# Patient Record
Sex: Female | Born: 1985
Health system: Southern US, Community
[De-identification: ages and names within clinical notes are randomized; demographics above are authoritative.]

## PROBLEM LIST (undated history)

## (undated) DIAGNOSIS — I10 Essential (primary) hypertension: Secondary | ICD-10-CM

## (undated) HISTORY — PX: NO PAST SURGERIES: SHX2092

---

## 2016-07-07 NOTE — L&D Delivery Note (Signed)
Delivery Note Patient presented yesterday with vaginal bleeding and scant prenatal care.  Received 1 dose of betamethasone last night.  At 3:55 PM a viable female was delivered via  (Presentation: OA).  APGAR: 3, 9; weight 2800g.   Placenta status: spontaneous, with significant clot. Active management of the 3rd stage of labor. Cord: 3VC with the following complications: none.  Clamped x2 and cut at 45 seconds due to fetal indications.  Anesthesia:  epidural Episiotomy:  none Lacerations:  none Est. Blood Loss (mL):  250  Mom to postpartum.  Baby to NICU.  Jill MerlesJulia Sparks 09/30/2016, 4:15 PM   I was gloved and present for entire delivery SVD without incident No difficulty with shoulders No lacerations  Jill SignsMarie L Onda Sparks, CNM

## 2016-09-29 ENCOUNTER — Inpatient Hospital Stay (HOSPITAL_COMMUNITY)
Admission: AD | Admit: 2016-09-29 | Discharge: 2016-10-01 | DRG: 774 | Disposition: A | Discharge status: Home / Self Care | Payer: Medicaid Other | Source: Ambulatory Visit | Attending: Family Medicine | Admitting: Family Medicine

## 2016-09-29 ENCOUNTER — Inpatient Hospital Stay (HOSPITAL_COMMUNITY): Payer: Medicaid Other

## 2016-09-29 ENCOUNTER — Encounter (HOSPITAL_COMMUNITY): Payer: Self-pay | Admitting: *Deleted

## 2016-09-29 ENCOUNTER — Inpatient Hospital Stay (HOSPITAL_COMMUNITY): Payer: Medicaid Other | Admitting: Anesthesiology

## 2016-09-29 DIAGNOSIS — O4593 Premature separation of placenta, unspecified, third trimester: Secondary | ICD-10-CM | POA: Diagnosis present

## 2016-09-29 DIAGNOSIS — O4693 Antepartum hemorrhage, unspecified, third trimester: Secondary | ICD-10-CM | POA: Diagnosis present

## 2016-09-29 DIAGNOSIS — O1002 Pre-existing essential hypertension complicating childbirth: Secondary | ICD-10-CM | POA: Diagnosis present

## 2016-09-29 DIAGNOSIS — Z3A40 40 weeks gestation of pregnancy: Secondary | ICD-10-CM

## 2016-09-29 DIAGNOSIS — O0933 Supervision of pregnancy with insufficient antenatal care, third trimester: Secondary | ICD-10-CM

## 2016-09-29 HISTORY — DX: Essential (primary) hypertension: I10

## 2016-09-29 LAB — URINALYSIS, ROUTINE W REFLEX MICROSCOPIC
Bilirubin Urine: NEGATIVE
GLUCOSE, UA: NEGATIVE mg/dL
Ketones, ur: NEGATIVE mg/dL
Leukocytes, UA: NEGATIVE
Nitrite: NEGATIVE
PH: 6 (ref 5.0–8.0)
PROTEIN: NEGATIVE mg/dL
SPECIFIC GRAVITY, URINE: 1.008 (ref 1.005–1.030)

## 2016-09-29 LAB — RAPID HIV SCREEN (HIV 1/2 AB+AG)
HIV 1/2 ANTIBODIES: NONREACTIVE
HIV-1 P24 Antigen - HIV24: NONREACTIVE

## 2016-09-29 LAB — CBC
HCT: 24.3 % — ABNORMAL LOW (ref 36.0–46.0)
HEMATOCRIT: 25.5 % — AB (ref 36.0–46.0)
HEMOGLOBIN: 8.6 g/dL — AB (ref 12.0–15.0)
Hemoglobin: 8.9 g/dL — ABNORMAL LOW (ref 12.0–15.0)
MCH: 27.1 pg (ref 26.0–34.0)
MCH: 27.5 pg (ref 26.0–34.0)
MCHC: 34.9 g/dL (ref 30.0–36.0)
MCHC: 35.4 g/dL (ref 30.0–36.0)
MCV: 77.5 fL — ABNORMAL LOW (ref 78.0–100.0)
MCV: 77.6 fL — ABNORMAL LOW (ref 78.0–100.0)
PLATELETS: 219 10*3/uL (ref 150–400)
Platelets: 201 10*3/uL (ref 150–400)
RBC: 3.29 MIL/uL — ABNORMAL LOW (ref 3.87–5.11)
RBC: 3.13 MIL/uL — ABNORMAL LOW (ref 3.87–5.11)
RDW: 13.6 % (ref 11.5–15.5)
RDW: 13.7 % (ref 11.5–15.5)
WBC: 13.9 10*3/uL — AB (ref 4.0–10.5)
WBC: 14 10*3/uL — ABNORMAL HIGH (ref 4.0–10.5)

## 2016-09-29 LAB — APTT: APTT: 29 s (ref 24–36)

## 2016-09-29 LAB — PROTIME-INR
INR: 1.13
Prothrombin Time: 14.6 seconds (ref 11.4–15.2)

## 2016-09-29 LAB — RAPID URINE DRUG SCREEN, HOSP PERFORMED
AMPHETAMINES: NOT DETECTED
BARBITURATES: NOT DETECTED
BENZODIAZEPINES: NOT DETECTED
COCAINE: NOT DETECTED
Opiates: NOT DETECTED
TETRAHYDROCANNABINOL: NOT DETECTED

## 2016-09-29 LAB — OB RESULTS CONSOLE HIV ANTIBODY (ROUTINE TESTING): HIV: NONREACTIVE

## 2016-09-29 LAB — HEPATITIS B SURFACE ANTIGEN: HEP B S AG: NEGATIVE

## 2016-09-29 LAB — FIBRINOGEN
FIBRINOGEN: 313 mg/dL (ref 210–475)
Fibrinogen: 291 mg/dL (ref 210–475)

## 2016-09-29 LAB — POCT FERN TEST: POCT Fern Test: NEGATIVE

## 2016-09-29 LAB — PREPARE RBC (CROSSMATCH)

## 2016-09-29 LAB — ABO/RH: ABO/RH(D): O POS

## 2016-09-29 IMAGING — US US MFM OB LIMITED
1 series · 15 of 28 positions shown · non-contrast
Comparison: none

[Series 1: us mfm ob limited · 59 acquisitions, 15 frames shown]
[im 1/59]
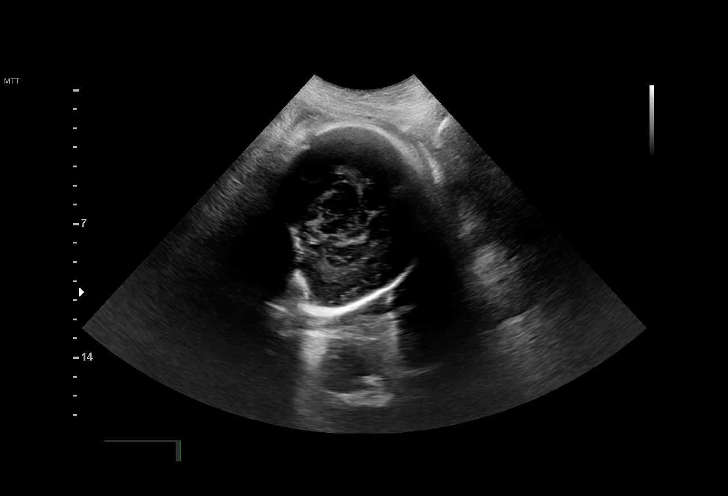
[im 5/59]
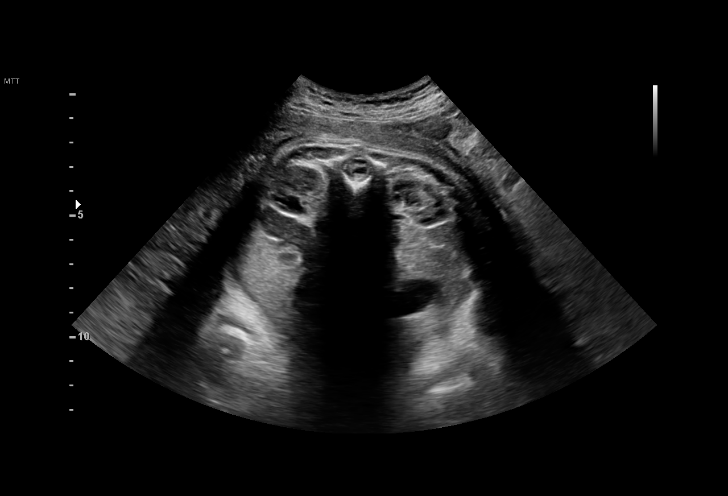
[im 9/59]
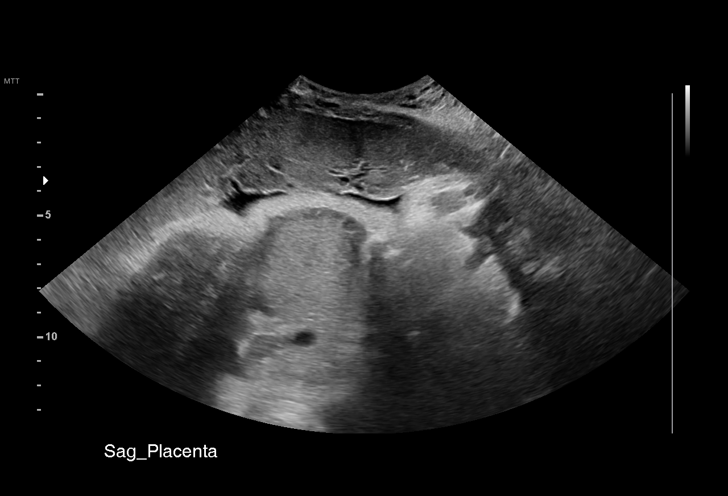
[im 13/59]
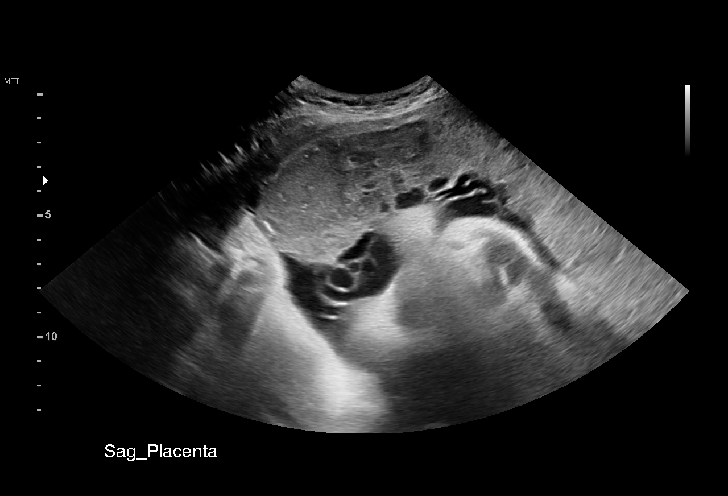
[im 18/59]
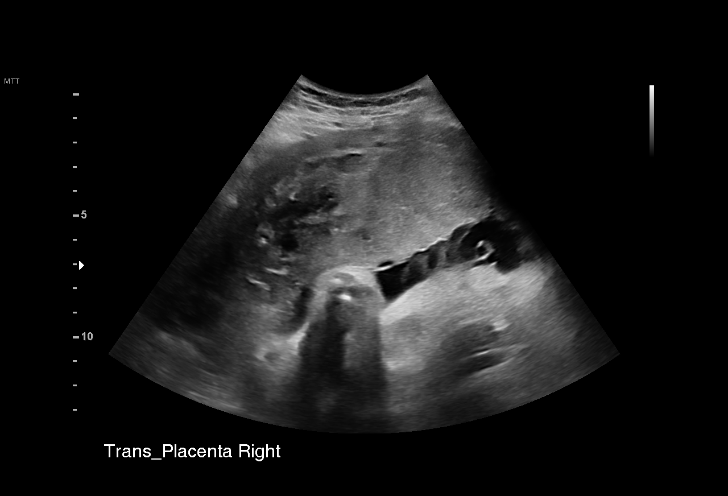
[im 22/59]
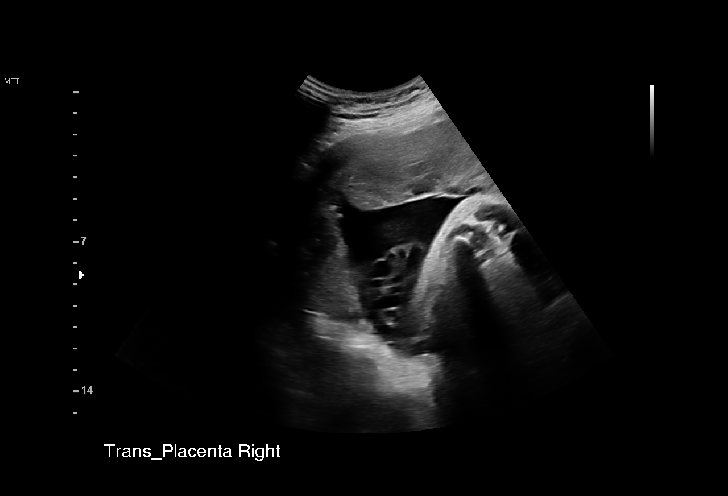
[im 26/59]
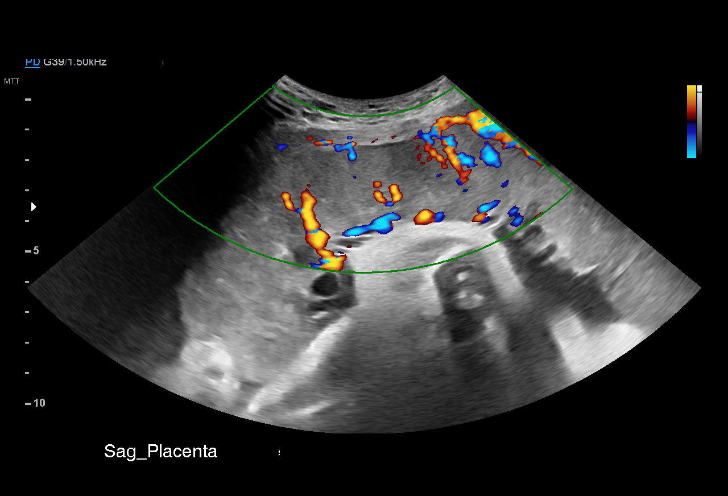
[im 31/59]
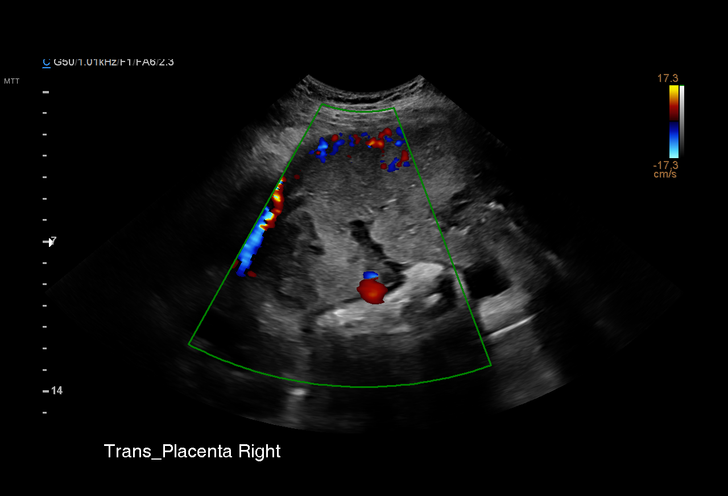
[im 33/59]
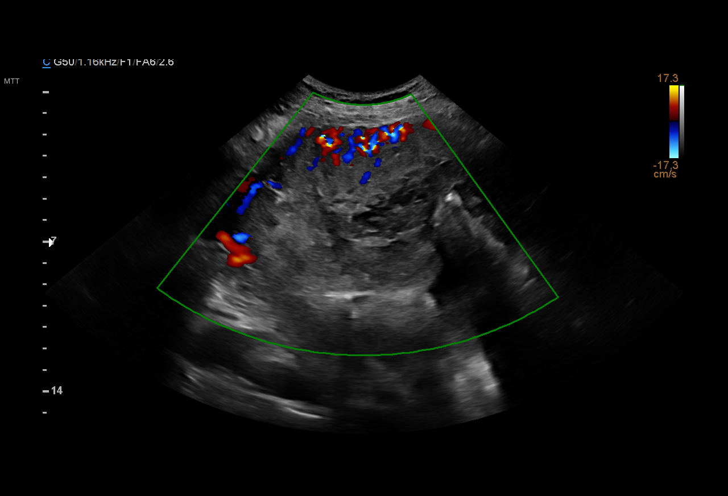
[im 37/59]
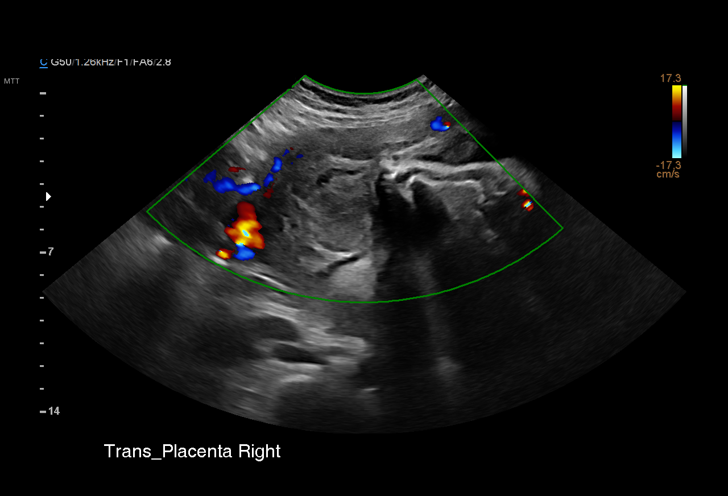
[im 41/59]
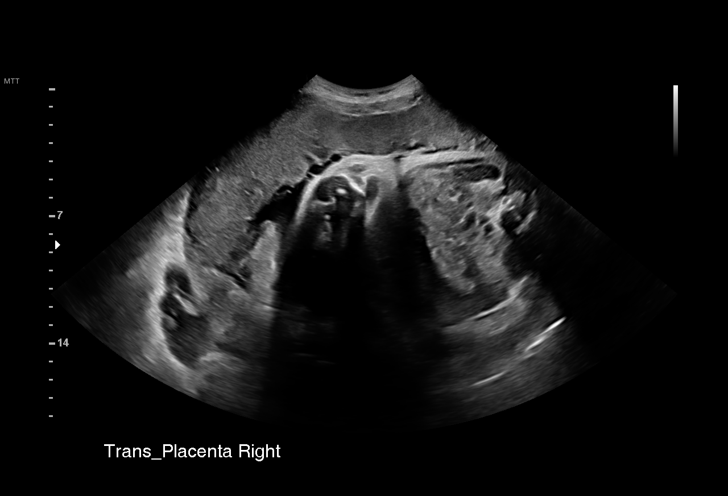
[im 46/59]
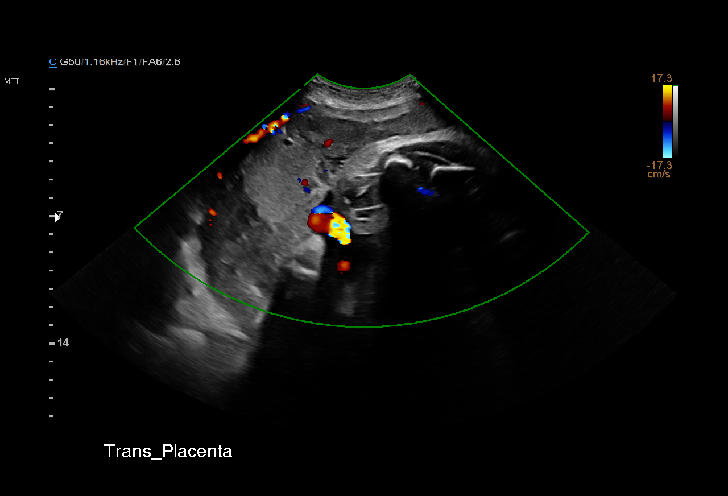
[im 50/59]
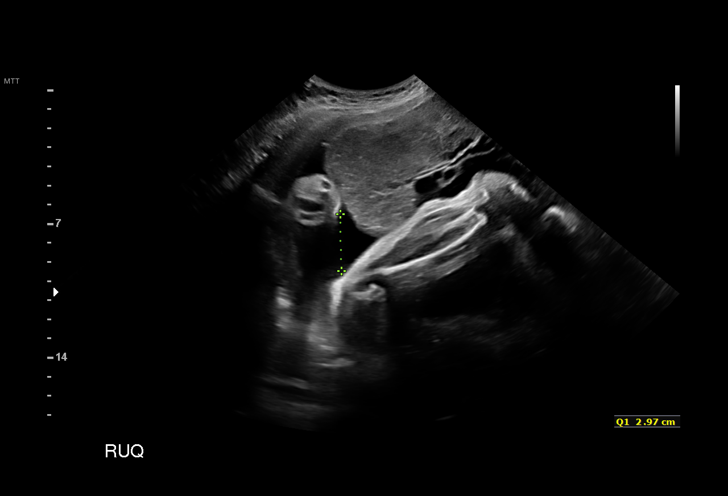
[im 54/59]
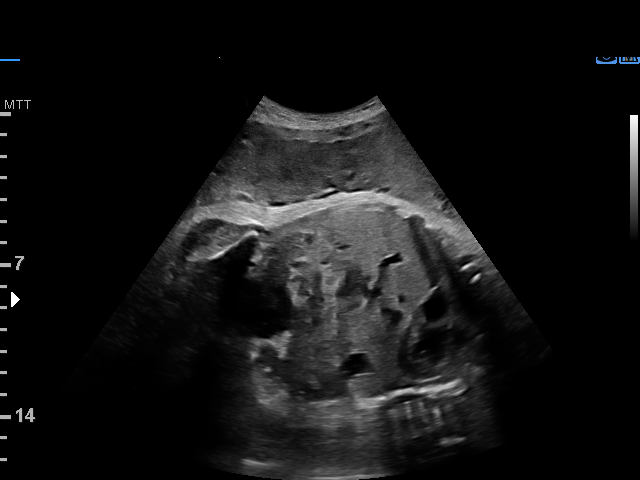
[im 59/59]
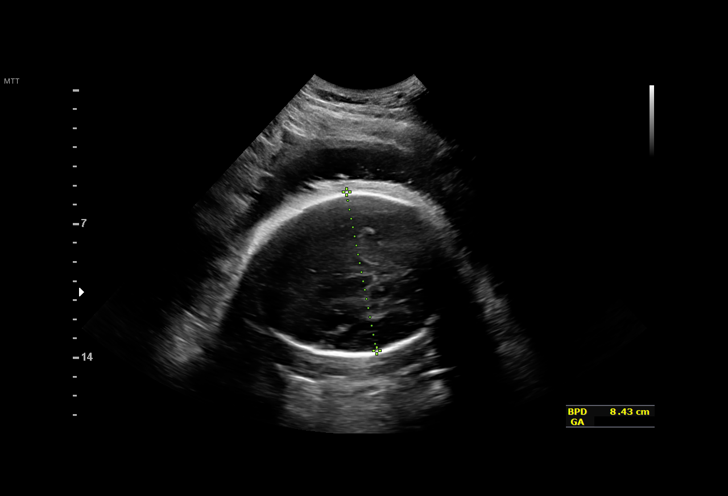

[15 of 28 positions shown; findings below may reference images not displayed]

MAU/Triage

Indications

33 weeks gestation of pregnancy
Vaginal bleeding in pregnancy, third trimester [5M]
Abdominal pain in pregnancy                    [5M]
Premature rupture of membranes - leaking       [5M]
fluid
Encounter for uncertain dates                  [5M]
Insufficient Prenatal Care                     [5M]
OB History

Gravidity:    3         Term:   2        Prem:   0        SAB:   0
TOP:          0       Ectopic:  0        Living: 2
Fetal Evaluation

Num Of Fetuses:     1
Fetal Heart         157
Rate(bpm):
Cardiac Activity:   Observed
Presentation:       Cephalic
Placenta:           Anterior, above cervical os
P. Cord Insertion:  Not well visualized

Amniotic Fluid
AFI FV:      Subjectively within normal limits

AFI Sum(cm)     %Tile       Largest Pocket(cm)
11.61           31

RUQ(cm)       RLQ(cm)       LUQ(cm)        LLQ(cm)
2.97

Comment:    Large subchorionic hemorrhage noted at Right Fundal and
Lateral edge of placenta approximately 10.2 x 5.2 x 9.1 cm.
Biometry

BPD:      84.3  mm     G. Age:  33w 6d         49  %
Gestational Age

U/S Today:     33w 6d                                        EDD:   [DATE]
Best:          33w 6d    Det. By:   U/S ([DATE])           EDD:   [DATE]
Cervix Uterus Adnexa

Cervix
Not visualized (advanced GA >[5M])

Uterus
No abnormality visualized.

Left Ovary
Size(cm)     3.73  x    2.35   x  1.2       Vol(ml):
Within normal limits. No adnexal mass visualized.

Right Ovary
Size(cm)     3.07  x    2.11   x  1.53      Vol(ml):
Within normal limits. No adnexal mass visualized.

Cul De Sac:   No free fluid seen.

Adnexa:       No abnormality visualized.
Impression

IUP at uncertain EGA with vaginal bleeding
BPD consistent with 33+6 weeks
Normal fetal movement and amniotic fluid
Normal cardiac activity
Retroplacental fluid collection as noted above
Recommendations

Clincal and US fidning consistent with evolving abruption
Discussed case with Faculty physician on call

## 2016-09-29 MED ORDER — ACETAMINOPHEN 325 MG PO TABS
650.0000 mg | ORAL_TABLET | ORAL | Status: DC | PRN
  Administered 2016-09-30: 650 mg via ORAL
  Filled 2016-09-29: qty 2

## 2016-09-29 MED ORDER — PENICILLIN G POTASSIUM 5000000 UNITS IJ SOLR
5.0000 10*6.[IU] | Freq: Once | INTRAVENOUS | Status: AC
  Administered 2016-09-29: 5 10*6.[IU] via INTRAVENOUS
  Filled 2016-09-29: qty 5

## 2016-09-29 MED ORDER — OXYCODONE-ACETAMINOPHEN 5-325 MG PO TABS: 1.0000 | ORAL_TABLET | ORAL | Status: DC | PRN

## 2016-09-29 MED ORDER — MIDAZOLAM HCL 2 MG/2ML IJ SOLN
INTRAMUSCULAR | Status: AC
  Filled 2016-09-29: qty 2

## 2016-09-29 MED ORDER — EPHEDRINE 5 MG/ML INJ
10.0000 mg | INTRAVENOUS | Status: DC | PRN
  Filled 2016-09-29: qty 2

## 2016-09-29 MED ORDER — LIDOCAINE HCL (PF) 1 % IJ SOLN
30.0000 mL | INTRAMUSCULAR | Status: DC | PRN
  Filled 2016-09-29: qty 30

## 2016-09-29 MED ORDER — LACTATED RINGERS IV SOLN
INTRAVENOUS | Status: DC
  Administered 2016-09-30: 09:00:00 via INTRAVENOUS

## 2016-09-29 MED ORDER — ONDANSETRON HCL 4 MG/2ML IJ SOLN
4.0000 mg | Freq: Four times a day (QID) | INTRAMUSCULAR | Status: DC | PRN
  Administered 2016-09-30 (×2): 4 mg via INTRAVENOUS
  Filled 2016-09-29 (×2): qty 2

## 2016-09-29 MED ORDER — OXYTOCIN 40 UNITS IN LACTATED RINGERS INFUSION - SIMPLE MED: 2.5000 [IU]/h | INTRAVENOUS | Status: DC

## 2016-09-29 MED ORDER — FENTANYL CITRATE (PF) 250 MCG/5ML IJ SOLN
INTRAMUSCULAR | Status: AC
  Filled 2016-09-29: qty 5

## 2016-09-29 MED ORDER — SODIUM CHLORIDE 0.9 % IV SOLN: Freq: Once | INTRAVENOUS | Status: DC

## 2016-09-29 MED ORDER — OXYTOCIN BOLUS FROM INFUSION
500.0000 mL | Freq: Once | INTRAVENOUS | Status: AC
  Administered 2016-09-30: 500 mL via INTRAVENOUS

## 2016-09-29 MED ORDER — LACTATED RINGERS IV SOLN: 500.0000 mL | INTRAVENOUS | Status: DC | PRN

## 2016-09-29 MED ORDER — SOD CITRATE-CITRIC ACID 500-334 MG/5ML PO SOLN
ORAL | Status: AC
  Filled 2016-09-29: qty 15

## 2016-09-29 MED ORDER — LACTATED RINGERS IV SOLN
INTRAVENOUS | Status: DC
  Administered 2016-09-29: 18:00:00 via INTRAVENOUS

## 2016-09-29 MED ORDER — FENTANYL 2.5 MCG/ML BUPIVACAINE 1/10 % EPIDURAL INFUSION (WH - ANES)
14.0000 mL/h | INTRAMUSCULAR | Status: DC | PRN
  Administered 2016-09-29 – 2016-09-30 (×3): 14 mL/h via EPIDURAL
  Filled 2016-09-29 (×3): qty 100

## 2016-09-29 MED ORDER — PHENYLEPHRINE 40 MCG/ML (10ML) SYRINGE FOR IV PUSH (FOR BLOOD PRESSURE SUPPORT)
80.0000 ug | PREFILLED_SYRINGE | INTRAVENOUS | Status: DC | PRN
  Filled 2016-09-29: qty 5

## 2016-09-29 MED ORDER — PHENYLEPHRINE 40 MCG/ML (10ML) SYRINGE FOR IV PUSH (FOR BLOOD PRESSURE SUPPORT)
80.0000 ug | PREFILLED_SYRINGE | INTRAVENOUS | Status: DC | PRN
  Filled 2016-09-29: qty 5
  Filled 2016-09-29: qty 10

## 2016-09-29 MED ORDER — LACTATED RINGERS IV SOLN: 500.0000 mL | Freq: Once | INTRAVENOUS | Status: DC

## 2016-09-29 MED ORDER — PENICILLIN G POT IN DEXTROSE 60000 UNIT/ML IV SOLN
3.0000 10*6.[IU] | INTRAVENOUS | Status: DC
  Administered 2016-09-29 – 2016-09-30 (×4): 3 10*6.[IU] via INTRAVENOUS
  Filled 2016-09-29 (×7): qty 50

## 2016-09-29 MED ORDER — SOD CITRATE-CITRIC ACID 500-334 MG/5ML PO SOLN: 30.0000 mL | ORAL | Status: DC | PRN

## 2016-09-29 MED ORDER — DIPHENHYDRAMINE HCL 50 MG/ML IJ SOLN
12.5000 mg | INTRAMUSCULAR | Status: DC | PRN
  Administered 2016-09-30: 12.5 mg via INTRAVENOUS
  Filled 2016-09-29: qty 1

## 2016-09-29 MED ORDER — BETAMETHASONE SOD PHOS & ACET 6 (3-3) MG/ML IJ SUSP
12.0000 mg | INTRAMUSCULAR | Status: DC
  Administered 2016-09-29: 12 mg via INTRAMUSCULAR
  Filled 2016-09-29 (×2): qty 2

## 2016-09-29 MED ORDER — OXYCODONE-ACETAMINOPHEN 5-325 MG PO TABS: 2.0000 | ORAL_TABLET | ORAL | Status: DC | PRN

## 2016-09-29 MED ORDER — LIDOCAINE HCL (PF) 1 % IJ SOLN
INTRAMUSCULAR | Status: DC | PRN
  Administered 2016-09-29 (×2): 5 mL via EPIDURAL

## 2016-09-29 MED ORDER — FLEET ENEMA 7-19 GM/118ML RE ENEM: 1.0000 | ENEMA | RECTAL | Status: DC | PRN

## 2016-09-29 NOTE — Anesthesia Preprocedure Evaluation (Signed)
Anesthesia Evaluation  Patient identified by MRN, date of birth, ID band Patient awake    Reviewed: Allergy & Precautions, Patient's Chart, lab work & pertinent test results  History of Anesthesia Complications Negative for: history of anesthetic complications  Airway Mallampati: II  TM Distance: >3 FB Neck ROM: Full    Dental no notable dental hx. (+) Dental Advisory Given   Pulmonary neg pulmonary ROS,    Pulmonary exam normal        Cardiovascular hypertension, Normal cardiovascular exam     Neuro/Psych negative neurological ROS  negative psych ROS   GI/Hepatic negative GI ROS, Neg liver ROS,   Endo/Other  negative endocrine ROS  Renal/GU negative Renal ROS     Musculoskeletal   Abdominal   Peds  Hematology   Anesthesia Other Findings   Reproductive/Obstetrics (+) Pregnancy                             Anesthesia Physical Anesthesia Plan  ASA: II  Anesthesia Plan: Epidural   Post-op Pain Management:    Induction:   Airway Management Planned:   Additional Equipment:   Intra-op Plan:   Post-operative Plan:   Informed Consent: I have reviewed the patients History and Physical, chart, labs and discussed the procedure including the risks, benefits and alternatives for the proposed anesthesia with the patient or authorized representative who has indicated his/her understanding and acceptance.   Dental advisory given  Plan Discussed with: Anesthesiologist  Anesthesia Plan Comments:         Anesthesia Quick Evaluation

## 2016-09-29 NOTE — Anesthesia Procedure Notes (Signed)
Epidural Patient location during procedure: OB Start time: 09/29/2016 9:48 PM End time: 09/29/2016 9:59 PM  Staffing Anesthesiologist: Heather RobertsSINGER, Jill Sparks Performed: anesthesiologist   Preanesthetic Checklist Completed: patient identified, site marked, pre-op evaluation, timeout performed, IV checked, risks and benefits discussed and monitors and equipment checked  Epidural Patient position: sitting Prep: DuraPrep Patient monitoring: heart rate, cardiac monitor, continuous pulse ox and blood pressure Approach: midline Location: L2-L3 Injection technique: LOR saline  Needle:  Needle type: Tuohy  Needle gauge: 17 G Needle length: 9 cm Needle insertion depth: 6 cm Catheter size: 20 Guage Catheter at skin depth: 11 cm Test dose: negative and Other  Assessment Events: blood not aspirated, injection not painful, no injection resistance and negative IV test  Additional Notes Informed consent obtained prior to proceeding including risk of failure, 1% risk of PDPH, risk of minor discomfort and bruising.  Discussed rare but serious complications including epidural abscess, permanent nerve injury, epidural hematoma.  Discussed alternatives to epidural analgesia and patient desires to proceed.  Timeout performed pre-procedure verifying patient name, procedure, and platelet count.  Patient tolerated procedure well.

## 2016-09-29 NOTE — Progress Notes (Signed)
Jill Sparks is a 31 y.o. G3P2002 at Unknown   Subjective: Patient comfortable. No concerns right now.  Objective: BP 123/73   Pulse 63   Temp 98.1 F (36.7 C) (Oral)   Resp 20   Ht 5\' 3"  (1.6 m)   Wt 168 lb (76.2 kg)   LMP  (Approximate)   SpO2 99%   BMI 29.76 kg/m  No intake/output data recorded. No intake/output data recorded.  FHT:  FHR: 145 bpm, variability: moderate,  accelerations:  Present,  decelerations:  Absent UC:   regular, every 2-3 minutes SVE:   Dilation: 3 Effacement (%): 50 Station: -3 Exam by:: Dr. Genevie AnnSchenk  Labs: Lab Results  Component Value Date   WBC 13.9 (H) 09/29/2016   HGB 8.9 (L) 09/29/2016   HCT 25.5 (L) 09/29/2016   MCV 77.5 (L) 09/29/2016   PLT 219 09/29/2016    Assessment / Plan: Spontaneous labor, progressing normally with active placental abruption. BMZ dose 1 given.  Labor: Progressing normally Preeclampsia:  None Fetal Wellbeing:  Category I Pain Control:  Labor support without medications I/D:  GBS unknown on PCN Anticipated MOD:  NSVD   Wendee Beaversavid J McMullen, DO, PGY-1 09/29/2016, 8:54 PM

## 2016-09-29 NOTE — Progress Notes (Signed)
OB Note D/w MFM Dr. Ezzard StandingNewman and given 33/6 and abruption recommend proceeding with delivery. NICU called and they are okay with bed space. Rpt cbc and fibrinogen to ensure stability at 2200; pt okay with early epidural. Rpt SVE unchanged at 3cm. Will repeat SVE after epidural and try to AROM.   Cornelia Copaharlie Douglas Smolinsky, Jr MD Attending Center for [facility] 09/29/2016 Time: 2100

## 2016-09-29 NOTE — MAU Note (Signed)
Patient c/o frequent contractions. States got up today and was "pouring" blood. States leaking fluid as well. +FM. [place] "[place] situation". Unsure of LMP/EDD. States started care 2 months ago and last seen a month ago.

## 2016-09-29 NOTE — Anesthesia Pain Management Evaluation Note (Signed)
  CRNA Pain Management Visit Note  Patient: Jill Sparks, 31 y.o., female  "Hello I am a member of the anesthesia team at [facility]. We have an anesthesia team available at all times to provide care throughout the hospital, including epidural management and anesthesia for C-section. I don't know your plan for the delivery whether it a natural birth, water birth, IV sedation, nitrous supplementation, doula or epidural, but we want to meet your pain goals."   1.Was your pain managed to your expectations on prior hospitalizations?   Yes   2.What is your expectation for pain management during this hospitalization?     Epidural  3.How can we help you reach that goal? unsure  Record the patient's initial score and the patient's pain goal.   Pain: 5  Pain Goal: 7 [facility] wants you to be able to say your pain was always managed very well.  Cephus ShellingBURGER,Sherrell Farish 09/29/2016

## 2016-09-29 NOTE — MAU Provider Note (Signed)
History     CSN: [id number]  Arrival date and time: 09/29/16 1710   First Provider Initiated Contact with Patient 09/29/16 1728      Chief Complaint  Patient presents with  . Vaginal Bleeding   HPI Jill Sparks is a 31 y.o. G3P2002 female at approximately [redacted] weeks gestation who presents with vaginal bleeding. Woke up this afternoon and felt like she was gushing fluid, when she looked down it was blood. Reports intermittent abdominal pain that has worsened today. Positive fetal movement. Denies abdominal trauma, MVA, or fall.  Has received limited prenatal care this pregnancy. Started prenatal care 2 months ago but hasn't been in a month; care was received by a Dr. Benedetto Goad in [place]. States she had 1 ultrasound that was "normal". Does not know her LMP (not even approximately) and does not know of a due date that was given to her by OB in [place]. States she should be due "about now". Recently moved to [place] due to social issues. Reports history of chronic hypertension; on meds prior to pregnancy, but no meds currently.   OB History    Gravida Para Term Preterm AB Living   3 2 2     2    SAB TAB Ectopic Multiple Live Births                  Past Medical History:  Diagnosis Date  . Hypertension    on meds prior to pregnancy    No past surgical history on file.  No family history on file.  Social History  Substance Use Topics  . Smoking status: Not on file  . Smokeless tobacco: Not on file  . Alcohol use Not on file    Allergies: Allergies not on file  No prescriptions prior to admission.    Review of Systems  Constitutional: Negative.   Gastrointestinal: Positive for abdominal pain.  Genitourinary: Positive for vaginal bleeding.   Physical Exam   Blood pressure 124/78, pulse 83, resp. rate 18, SpO2 99 %.  Physical Exam  Nursing note and vitals reviewed. Constitutional: She is oriented to person, place, and time. She appears well-developed and well-nourished.  No distress.  HENT:  Head: Normocephalic and atraumatic.  Eyes: Conjunctivae are normal. Right eye exhibits no discharge. Left eye exhibits no discharge. No scleral icterus.  Neck: Normal range of motion.  Respiratory: Effort normal. No respiratory distress.  GI: There is no tenderness.  Fundal height 40 cm  Genitourinary: There is bleeding (moderate amount of dark red watery blood. Unable to visualize cervix d/t amount of bleeding. ) in the vagina.  Genitourinary Comments: SVE difficulty d/t pt discomfort. Able to feel anterior edge of cervix in posterior position.   Neurological: She is alert and oriented to person, place, and time.  Skin: Skin is warm and dry. She is not diaphoretic.  Psychiatric: She has a normal mood and affect. Her behavior is normal. Judgment and thought content normal.   Fetal Tracing:  Baseline: 150 Variability: moderate Accelerations: 10x10 Decelerations: none  Toco: 2-3 mins, 80-110 sec, palpate moderate MAU Course  Procedures Results for orders placed or performed during the hospital encounter of 09/29/16 (from the past 24 hour(s))  CBC     Status: Abnormal   Collection Time: 09/29/16  5:50 PM  Result Value Ref Range   WBC 13.9 (H) 4.0 - 10.5 K/uL   RBC 3.29 (L) 3.87 - 5.11 MIL/uL   Hemoglobin 8.9 (L) 12.0 - 15.0 g/dL   HCT 78.2 (  L) 36.0 - 46.0 %   MCV 77.5 (L) 78.0 - 100.0 fL   MCH 27.1 26.0 - 34.0 pg   MCHC 34.9 30.0 - 36.0 g/dL   RDW 86.513.6 78.411.5 - 69.615.5 %   Platelets 219 150 - 400 K/uL    MDM Category 1 tracing Ultrasound ordered d/t moderate amount of bleeding noted on exam IV started & labs collected  Ultrasound shows placental abruption S/w Dr. Vergie LivingPickens regarding ultrasound, history, & exam. Will admit to birthing suites.   Assessment and Plan  A; 1. Placental abruption in third trimester   2. [redacted] weeks gestation of pregnancy   3. Vaginal bleeding in pregnancy, third trimester   4. Limited prenatal care in third trimester    P: Admit  to birthing suites Keep pt NPO  Judeth Hornrin Deveron Shamoon 09/29/2016, 5:27 PM

## 2016-09-30 ENCOUNTER — Encounter (HOSPITAL_COMMUNITY): Payer: Self-pay | Admitting: *Deleted

## 2016-09-30 DIAGNOSIS — Z3A40 40 weeks gestation of pregnancy: Secondary | ICD-10-CM

## 2016-09-30 DIAGNOSIS — O4593 Premature separation of placenta, unspecified, third trimester: Secondary | ICD-10-CM

## 2016-09-30 LAB — HEPATITIS C ANTIBODY

## 2016-09-30 LAB — RUBELLA ANTIBODY, IGM: Rubella IgM: <20 AU/mL (ref 0.0–19.9)

## 2016-09-30 LAB — OB RESULTS CONSOLE GBS: GBS: POSITIVE

## 2016-09-30 LAB — CULTURE, BETA STREP (GROUP B ONLY)

## 2016-09-30 LAB — RPR: RPR Ser Ql: NONREACTIVE

## 2016-09-30 MED ORDER — IBUPROFEN 600 MG PO TABS
600.0000 mg | ORAL_TABLET | Freq: Four times a day (QID) | ORAL | Status: DC
  Administered 2016-10-01 (×2): 600 mg via ORAL
  Filled 2016-09-30 (×2): qty 1

## 2016-09-30 MED ORDER — PRENATAL MULTIVITAMIN CH: 1.0000 | ORAL_TABLET | Freq: Every day | ORAL | Status: DC

## 2016-09-30 MED ORDER — ZOLPIDEM TARTRATE 5 MG PO TABS: 5.0000 mg | ORAL_TABLET | Freq: Every evening | ORAL | Status: DC | PRN

## 2016-09-30 MED ORDER — COCONUT OIL OIL: 1.0000 "application " | TOPICAL_OIL | Status: DC | PRN

## 2016-09-30 MED ORDER — DIPHENHYDRAMINE HCL 25 MG PO CAPS: 25.0000 mg | ORAL_CAPSULE | Freq: Four times a day (QID) | ORAL | Status: DC | PRN

## 2016-09-30 MED ORDER — TETANUS-DIPHTH-ACELL PERTUSSIS 5-2.5-18.5 LF-MCG/0.5 IM SUSP: 0.5000 mL | Freq: Once | INTRAMUSCULAR | Status: DC

## 2016-09-30 MED ORDER — SIMETHICONE 80 MG PO CHEW: 80.0000 mg | CHEWABLE_TABLET | ORAL | Status: DC | PRN

## 2016-09-30 MED ORDER — ONDANSETRON HCL 4 MG/2ML IJ SOLN: 4.0000 mg | INTRAMUSCULAR | Status: DC | PRN

## 2016-09-30 MED ORDER — TERBUTALINE SULFATE 1 MG/ML IJ SOLN: 0.2500 mg | Freq: Once | INTRAMUSCULAR | Status: DC | PRN

## 2016-09-30 MED ORDER — ONDANSETRON HCL 4 MG PO TABS
4.0000 mg | ORAL_TABLET | ORAL | Status: DC | PRN
Start: 2016-09-30 — End: 2016-10-01

## 2016-09-30 MED ORDER — SENNOSIDES-DOCUSATE SODIUM 8.6-50 MG PO TABS
2.0000 | ORAL_TABLET | ORAL | Status: DC
  Administered 2016-10-01: 2 via ORAL
  Filled 2016-09-30: qty 2

## 2016-09-30 MED ORDER — BENZOCAINE-MENTHOL 20-0.5 % EX AERO: 1.0000 "application " | INHALATION_SPRAY | CUTANEOUS | Status: DC | PRN

## 2016-09-30 MED ORDER — WITCH HAZEL-GLYCERIN EX PADS: 1.0000 "application " | MEDICATED_PAD | CUTANEOUS | Status: DC | PRN

## 2016-09-30 MED ORDER — DIBUCAINE 1 % RE OINT: 1.0000 "application " | TOPICAL_OINTMENT | RECTAL | Status: DC | PRN

## 2016-09-30 MED ORDER — ACETAMINOPHEN 325 MG PO TABS
650.0000 mg | ORAL_TABLET | ORAL | Status: DC | PRN
  Administered 2016-10-01: 650 mg via ORAL
  Filled 2016-09-30: qty 2

## 2016-09-30 MED ORDER — OXYTOCIN 40 UNITS IN LACTATED RINGERS INFUSION - SIMPLE MED
1.0000 m[IU]/min | INTRAVENOUS | Status: DC
  Administered 2016-09-30: 2 m[IU]/min via INTRAVENOUS
  Filled 2016-09-30: qty 1000

## 2016-09-30 NOTE — Progress Notes (Signed)
Jill Sparks is a 31 y.o. G3P2002 at Unknown   Subjective: Patient doing well. Feeling better after food.  Objective: BP (!) 114/57   Pulse 74   Temp 98.6 F (37 C) (Oral)   Resp 18   Ht 5\' 3"  (1.6 m)   Wt 168 lb (76.2 kg)   LMP  (Approximate)   SpO2 99%   BMI 29.76 kg/m  No intake/output data recorded. No intake/output data recorded.  FHT:  FHR: 135 bpm, variability: moderate,  accelerations:  Present,  decelerations:  Absent UC:   regular, every 4 minutes SVE:   Dilation: 4 Effacement (%): 50 Station: -3 Exam by:: Dr. Genevie AnnSchenk  Labs: Lab Results  Component Value Date   WBC 14.0 (H) 09/29/2016   HGB 8.6 (L) 09/29/2016   HCT 24.3 (L) 09/29/2016   MCV 77.6 (L) 09/29/2016   PLT 201 09/29/2016    Assessment / Plan: Spontaneous labor, no cervical change in the last 6 hours.  Will start pitocin and titrate to adequate contractions.   Labor: Progressing normally Preeclampsia:  None Fetal Wellbeing:  Category I Pain Control:  Epidural I/D:  GBS pending on PCN Anticipated MOD:  NSVD  Jill AbbotNimeka Madgeline Rayo, MD, PGY-2 09/30/2016, 9:31 AM

## 2016-09-30 NOTE — Progress Notes (Signed)
Patient seen. Doing well. No complaints. She reports after epidural contraction are not painful. They have sapced out after AROM. Bleeding has been minimal. Category 1 tracing. Plan for pitocin. Continue to monitor.   Jill PennaNicholas Yasmin Bronaugh, MD 09/30/16 [id number]

## 2016-09-30 NOTE — Progress Notes (Signed)
Patient ID: Jill Sparks, female   DOB: [birth year], 31 y.o.   MRN: [id number] Sleeping  Vitals:   09/30/16 1101 09/30/16 1131 09/30/16 1202 09/30/16 1332  BP: (!) 109/53 (!) 112/56 (!) 111/46 (!) 147/59  Pulse: 77 70 73 70  Resp: 16 16 16 16   Temp:   97.6 F (36.4 C)   TempSrc:   Oral   SpO2:      Weight:      Height:       FHR stable and reassuring UCs regular  Dilation: 5 Effacement (%): 90 Cervical Position: Anterior Station: -2 Presentation: Vertex Exam by:: raney gagnon rnc  Will continue to observe

## 2016-09-30 NOTE — Progress Notes (Signed)
Jill Sparks is a 31 y.o. G3P2002 at Unknown   Subjective: Patient doing well. Mouth feels dry.  Objective: BP 122/68   Pulse 68   Temp 98.4 F (36.9 C) (Oral)   Resp 18   Ht 5\' 3"  (1.6 m)   Wt 168 lb (76.2 kg)   LMP  (Approximate)   SpO2 99%   BMI 29.76 kg/m  No intake/output data recorded. No intake/output data recorded.  FHT:  FHR: 135 bpm, variability: moderate,  accelerations:  Present,  decelerations:  Absent UC:   regular, every 4 minutes SVE:   Dilation: 4 Effacement (%): 50 Station: -3 Exam by:: Dr. Genevie AnnSchenk  Labs: Lab Results  Component Value Date   WBC 14.0 (H) 09/29/2016   HGB 8.6 (L) 09/29/2016   HCT 24.3 (L) 09/29/2016   MCV 77.6 (L) 09/29/2016   PLT 201 09/29/2016    Assessment / Plan: Spontaneous labor, progressing normally.   Labor: Progressing normally Preeclampsia:  None Fetal Wellbeing:  Category I Pain Control:  Epidural I/D:  GBS unknown on PCN Anticipated MOD:  NSVD  Wendee Beaversavid J Kleber Crean, DO, PGY-1 09/30/2016, 5:43 AM

## 2016-09-30 NOTE — Progress Notes (Signed)
CRITICAL VALUE ALERT  Critical value received:  GBS positive  Date of notification:  09/30/16  Time of notification:  1229  Critical value read back:Yes.    Nurse who received alert:  Janine OresSusie Tyrik Stetzer RN  MD notified (1st page):  1230  Time of first page:  N/A  MD notified (2nd page):  Time of second page:  Responding MD:  Dr Elenore Paddyhoden  Time MD responded:  1230

## 2016-09-30 NOTE — Progress Notes (Signed)
Jill Sparks is a 31 y.o. G3P2002 at Unknown   Subjective: Patient more uncomfortable with pressure.   Objective: BP (!) 104/58   Pulse 77   Temp 98 F (36.7 C) (Oral)   Resp 18   Ht 5\' 3"  (1.6 m)   Wt 168 lb (76.2 kg)   LMP  (Approximate)   SpO2 99%   BMI 29.76 kg/m  No intake/output data recorded. No intake/output data recorded.  FHT:  FHR: 140 bpm, variability: moderate,  accelerations:  Present,  decelerations:  Absent UC:   regular, every 2 minutes SVE:   Dilation: 3 Effacement (%): 50 Station: -3 Exam by:: Jill Sparks  Labs: Lab Results  Component Value Date   WBC 14.0 (H) 09/29/2016   HGB 8.6 (L) 09/29/2016   HCT 24.3 (L) 09/29/2016   MCV 77.6 (L) 09/29/2016   PLT 201 09/29/2016    Assessment / Plan: Spontaneous labor, progressing normally. Bloody AROM at 0110.  Labor: Progressing normally Preeclampsia:  None Fetal Wellbeing:  Category I Pain Control:  Epidural I/D:  GBS unknown on PCN Anticipated MOD:  NSVD  Wendee Beaversavid J McMullen, DO, PGY-1 09/30/2016, 1:15 AM

## 2016-09-30 NOTE — H&P (Signed)
"HPI Jill Sparks is a 31 y.o. G18P2002 female at approximately [redacted] weeks gestation who presents with vaginal bleeding. Woke up this afternoon and felt like she was gushing fluid, when she looked down it was blood. Reports intermittent abdominal pain that has worsened today. Positive fetal movement. Denies abdominal trauma, MVA, or fall.  Has received limited prenatal care this pregnancy. Started prenatal care 2 months ago but hasn't been in a month; care was received by a Dr. Benedetto Goad in [place]. States she had 1 ultrasound that was "normal". Does not know her LMP (not even approximately) and does not know of a due date that was given to her by OB in [place]. States she should be due "about now". Recently moved to [place] due to social issues. Reports history of chronic hypertension; on meds prior to pregnancy, but no meds currently.           OB History    Gravida Para Term Preterm AB Living   3 2 2     2    SAB TAB Ectopic Multiple Live Births                      Past Medical History:  Diagnosis Date  . Hypertension    on meds prior to pregnancy    No past surgical history on file.  No family history on file.      Social History  Substance Use Topics  . Smoking status: Not on file  . Smokeless tobacco: Not on file  . Alcohol use Not on file    Allergies: Allergies not on file  No prescriptions prior to admission.    Review of Systems  Constitutional: Negative.   Gastrointestinal: Positive for abdominal pain.  Genitourinary: Positive for vaginal bleeding.   Physical Exam   Blood pressure 124/78, pulse 83, resp. rate 18, SpO2 99 %.  Physical Exam  Nursing note and vitals reviewed. Constitutional: She is oriented to person, place, and time. She appears well-developed and well-nourished. No distress.  HENT:  Head: Normocephalic and atraumatic.  Eyes: Conjunctivae are normal. Right eye exhibits no discharge. Left eye exhibits no discharge. No  scleral icterus.  Neck: Normal range of motion.  Respiratory: Effort normal. No respiratory distress.  GI: There is no tenderness.  Fundal height 40 cm  Genitourinary: There is bleeding (moderate amount of dark red watery blood. Unable to visualize cervix d/t amount of bleeding. ) in the vagina.  Genitourinary Comments: SVE difficulty d/t pt discomfort. Able to feel anterior edge of cervix in posterior position.   Neurological: She is alert and oriented to person, place, and time.  Skin: Skin is warm and dry. She is not diaphoretic.  Psychiatric: She has a normal mood and affect. Her behavior is normal. Judgment and thought content normal.   Fetal Tracing:  Baseline: 150 Variability: moderate Accelerations: 10x10 Decelerations: none  Toco: 2-3 mins, 80-110 sec, palpate moderate MAU Course  Procedures LabResultsLast24Hours       Results for orders placed or performed during the hospital encounter of 09/29/16 (from the past 24 hour(s))  CBC     Status: Abnormal   Collection Time: 09/29/16  5:50 PM  Result Value Ref Range   WBC 13.9 (H) 4.0 - 10.5 K/uL   RBC 3.29 (L) 3.87 - 5.11 MIL/uL   Hemoglobin 8.9 (L) 12.0 - 15.0 g/dL   HCT 09.8 (L) 11.9 - 14.7 %   MCV 77.5 (L) 78.0 - 100.0 fL   MCH 27.1  26.0 - 34.0 pg   MCHC 34.9 30.0 - 36.0 g/dL   RDW 16.113.6 09.611.5 - 04.515.5 %   Platelets 219 150 - 400 K/uL      MDM Category 1 tracing Ultrasound ordered d/t moderate amount of bleeding noted on exam IV started & labs collected  Ultrasound shows placental abruption S/w Dr. Vergie LivingPickens regarding ultrasound, history, & exam. Will admit to birthing suites.   Assessment and Plan  A; 1. Placental abruption in third trimester   2. [redacted] weeks gestation of pregnancy   3. Vaginal bleeding in pregnancy, third trimester   4. Limited prenatal care in third trimester    P: Admit to birthing suites Keep pt NPO"  Noted at time of delivery patient did not have H&P.  MAU note copied  forward.  No additional updates.  Charlsie MerlesJulia Rhoden, MD, PGY3 09/30/16 1622  The patient was seen and examined by me also Agree with note NST reactive and reassuring UCs as listed Cervical exams as listed in note  Aviva SignsMarie L Breeonna Mone, CNM

## 2016-10-01 LAB — CULTURE, OB URINE: Culture: NO GROWTH

## 2016-10-01 MED ORDER — IBUPROFEN 600 MG PO TABS: 600.0000 mg | ORAL_TABLET | Freq: Four times a day (QID) | ORAL | 0 refills | Status: AC

## 2016-10-01 NOTE — Discharge Summary (Signed)
OB Discharge Summary     Patient Name: Jill Sparks DOB: December 22, 1985 MRN: [id number]  Date of admission: 09/29/2016 Delivering MD: Aviva SignsWILLIAMS, MARIE L   Date of discharge: 10/01/2016  Admitting diagnosis: 40WKS WATER BROKE, CTX  Intrauterine pregnancy: 5832w0d     Secondary diagnosis:  Active Problems:   Placenta abruptio, antepartum, third trimester  Additional problems: none     Discharge diagnosis: Preterm Pregnancy Delivered                                                                                                Post partum procedures:none  Augmentation: Pitocin  Complications: Placental Abruption  Hospital course:  Onset of Labor With Vaginal Delivery     31 y.o. yo [id number] at 6232w0d was admitted in Latent Labor on 09/29/2016.  She presented with vaginal bleeding and was noted to have a 10 cm abruption.  She did not receive prenatal care prior to admission.  Ultrasound dating at time of admission was 2174w6d.  She was given betamethasone x1. She did require augmentation with pitocin. Patient had an uncomplicated labor course as follows:  Membrane Rupture Time/Date: 1:18 AM ,09/30/2016   Intrapartum Procedures: Episiotomy: None [1]                                         Lacerations:  None [1]  Patient had a delivery of a Viable infant. 09/30/2016  Information for the patient's newborn:  Arthor CaptainMcNair, Girl Loyce [[id number]][[id number]]  Delivery Method: Vaginal, Spontaneous Delivery (Filed from Delivery Summary)    Patient had an uncomplicated postpartum course.  She is ambulating, tolerating a regular diet, passing flatus, and urinating well. Patient is discharged home in stable condition on 10/01/16.   Physical exam  Vitals:   09/30/16 1906 09/30/16 2020 10/01/16 0017 10/01/16 0605  BP: 124/61 133/74 132/73 113/62  Pulse: 64 67 62 65  Resp: 16 18 16 16   Temp: 98.2 F (36.8 C) 98.4 F (36.9 C)    TempSrc: Oral Oral    SpO2: 98%     Weight:      Height:       General: alert,  cooperative and no distress Lochia: appropriate Uterine Fundus: firm Incision: N/A DVT Evaluation: No evidence of DVT seen on physical exam. Psych:  Flat affect, denies SI, HI Labs: Lab Results  Component Value Date   WBC 14.0 (H) 09/29/2016   HGB 8.6 (L) 09/29/2016   HCT 24.3 (L) 09/29/2016   MCV 77.6 (L) 09/29/2016   PLT 201 09/29/2016   No flowsheet data found.  Discharge instruction: per After Visit Summary and "Baby and Me Booklet".  After visit meds:  Allergies as of 10/01/2016   No Known Allergies     Medication List    TAKE these medications   acetaminophen 500 MG tablet Commonly known as:  TYLENOL Take 1,000 mg by mouth every 6 (six) hours as needed for mild pain or headache.   ibuprofen 600 MG tablet Commonly known as:  ADVIL,MOTRIN Take 1  tablet (600 mg total) by mouth every 6 (six) hours.   prenatal multivitamin Tabs tablet Take 1 tablet by mouth daily at 12 noon.       Diet: routine diet  Activity: Advance as tolerated. Pelvic rest for 6 weeks.   Outpatient follow up:2 weeks Follow up Appt:No future appointments. Follow up Visit:No Follow-up on file.  Postpartum contraception: desires BTL  Newborn Data: Live born female  Birth Weight: 6 lb 2.8 oz (2800 g) APGAR: 3, 9  Baby Feeding: Bottle Disposition:NICU, MOC considering adoption, SW involved   10/01/2016 Charlsie Merles, MD

## 2016-10-01 NOTE — Progress Notes (Signed)
Discharge instructions given, questions answered, pt states understanding.  Pt states she has a follow-up appointment with social worker in the morning

## 2016-10-01 NOTE — Discharge Instructions (Signed)

## 2016-10-01 NOTE — Anesthesia Postprocedure Evaluation (Signed)
Anesthesia Post Note  Patient: Jill Sparks  Procedure(s) Performed: * No procedures listed *  Patient location during evaluation: [facility] Anesthesia Type: Epidural Level of consciousness: awake and alert Pain management: satisfactory to patient Vital Signs Assessment: post-procedure vital signs reviewed and stable Respiratory status: respiratory function stable Cardiovascular status: stable Postop Assessment: no headache, no backache, epidural receding, patient able to bend at knees, no signs of nausea or vomiting and adequate PO intake Anesthetic complications: no        Last Vitals:  Vitals:   10/01/16 0605 10/01/16 0738  BP: 113/62 135/67  Pulse: 65 75  Resp: 16 16  Temp:  36.7 C    Last Pain:  Vitals:   10/01/16 0738  TempSrc: Oral  PainSc:    Pain Goal: Patients Stated Pain Goal: 2 (09/30/16 1815)               Karleen DolphinFUSSELL,Raiyan Dalesandro

## 2016-10-01 NOTE — Progress Notes (Signed)
I received a referral from Lulu Ridingolleen Shaw, [place], who stated that pt's 29 year-old daughter might need some support so that pt could have some time to think and process.  I spent time coloring and talking with her daughter in the room and was able to talk with pt some as well.  She was struggling to make a decision regarding adoption and mostly just needed space to think.  She was also eager to get home because her older daughter, who is in school, would have no one else to pick her up from school.  She was very quiet as she reflected, but was appreciative of the support with her daughter.  Chaplain Dyanne CarrelKaty Kevion Fatheree, Bcc PAger, [phone] 4:19 PM    10/01/16 1600  Clinical Encounter Type  Visited With Patient and family together  Visit Type Spiritual support  Referral From Social work  Spiritual Encounters  Spiritual Needs Emotional  Stress Factors  Patient Stress Factors Major life changes

## 2016-10-02 NOTE — Progress Notes (Signed)
CLINICAL SOCIAL WORK MATERNAL/CHILD NOTE  Patient Details  Name: Jill Sparks MRN: [id number] Date of Birth: 09/30/2014  Date:  10/02/2016  Clinical Social Worker Initiating Note:  Terri Piedra, [place] Date/ Time Initiated:  10/01/16/1030     Child's Name:  unnamed at this    Legal Guardian:  Mother Jill Sparks)   Need for Interpreter:  None   Date of Referral:  10/01/16     Reason for Referral:  Adoption , Late or No Prenatal Care    Referral Source:  Physician   Address:  [address] [place]  Phone number:  [phone]   Household Members:  Minor Children (MOB reports that she lives with her two daughters: Jill Sparks/age 59 and Jill Sparks/age 26)   Natural Supports (not living in the home):  Friends, Immediate Family (MOB reports that she has a friend named Jill Sparks here and reports that her mother is supportive living in [place] and her father is supportive living in [place])   Professional Supports: None   Employment:     Type of Work:  (MOB states she is "driven" and that she has Training and development officer in [employer], CNA and medical aid.  She is actively looking for a work from home position.)   Education:      Pensions consultant:   (Medicaid Potential per facesheet)   Other Resources:  Child Support   Cultural/Religious Considerations Which May Impact Care: None stated.    Strengths:  Ability to meet basic needs  (MOB reports that her parents are financially supporting her at this time.  She has not made preparations for baby as she is considering making an adoption plan.)   Risk Factors/Current Problems:  Other (Comment) (MOB is concerned that she cannot provide for another baby, but is conflicted about making an adoption plan.)   Cognitive State:  Able to Concentrate , Alert , Insightful , Linear Thinking , Goal Oriented    Mood/Affect:  Calm , Sad , Interested , Tearful    CSW Assessment: CSW met with MOB in her third floor  room/319 to offer support and complete assessment due to adoption.  MOB was quiet, and tearful, but receptive to processing her thoughts and feelings regarding baby and her plans with CSW.  MOB's 67 year old daughter was with her and caused some distraction, but was well behaved.  CSW called Chaplain/Jill Sparks to see if she would be available to provide support to MOB and her daughter as well.   MOB reports that she was late to [facility] because she did not have transportation until late in the pregnancy.  She reports moving to [place] 2 weeks ago from [place].  She states she came to this area with a friend who decided she didn't like it and returned to [place].  MOB considered returning to [place] also, as she received a call recently from the Section 8 office, but reports that she was told it would still be a long wait to get into housing there.  She states plans to remain in [place] if possible and reports that she has already accomplished many things in the two weeks she has been here.  She reports that her 63 year old daughter goes to [employer] and that she likes the school so far.  She states that she has applied for some work from home jobs and is in the process of getting the equipment needed (land line, headset, etc) in order to be eligible.  She reports  that her parents are financially and emotionally supportive and have paid her rent through June.   She states that she has contacted an agency regarding plans for adoption of her infant to a couple in [place].  She is significantly conflicted about this decision and states she does not know what the right choice is at this time.  CSW helped her process her thoughts and asked her to envision her life and tell CSW what she sees.  She replied, "I see my children in school and my baby in daycare."  After a long discussion and recommendation for counseling follow up regardless of which plan she chooses, CSW offered MOB time to process and reflect in  private.   CSW received call from OB that MOB would like to discharge today.  CSW spoke with MOB who states she will meet with CSW in the morning because she needs to go home for her 29 year old.  CSW agreed.  CSW Plan/Description:  Information/Referral to [employer] , Psychosocial Support and Ongoing Assessment of Needs    Alphonzo Cruise, [place] 10/02/2016, 10:46 AM

## 2016-10-03 LAB — TYPE AND SCREEN
ABO/RH(D): O POS
Antibody Screen: NEGATIVE
Unit division: 0
Unit division: 0

## 2016-10-03 LAB — BPAM RBC
Blood Product Expiration Date: 201804202359
Blood Product Expiration Date: 201804202359
UNIT TYPE AND RH: 5100
Unit Type and Rh: 5100

## 2016-10-05 ENCOUNTER — Inpatient Hospital Stay (HOSPITAL_COMMUNITY)
Admission: AD | Admit: 2016-10-05 | Discharge: 2016-10-05 | Disposition: A | Discharge status: Home / Self Care | Payer: Medicaid Other | Source: Ambulatory Visit | Attending: Obstetrics and Gynecology | Admitting: Obstetrics and Gynecology

## 2016-10-05 ENCOUNTER — Encounter (HOSPITAL_COMMUNITY): Payer: Self-pay

## 2016-10-05 DIAGNOSIS — I10 Essential (primary) hypertension: Secondary | ICD-10-CM | POA: Diagnosis present

## 2016-10-05 DIAGNOSIS — R51 Headache: Secondary | ICD-10-CM | POA: Diagnosis present

## 2016-10-05 DIAGNOSIS — Z79899 Other long term (current) drug therapy: Secondary | ICD-10-CM | POA: Insufficient documentation

## 2016-10-05 DIAGNOSIS — O165 Unspecified maternal hypertension, complicating the puerperium: Secondary | ICD-10-CM

## 2016-10-05 DIAGNOSIS — R519 Headache, unspecified: Secondary | ICD-10-CM

## 2016-10-05 LAB — URINALYSIS, ROUTINE W REFLEX MICROSCOPIC
Bilirubin Urine: NEGATIVE
Glucose, UA: NEGATIVE mg/dL
KETONES UR: NEGATIVE mg/dL
Nitrite: NEGATIVE
Protein, ur: NEGATIVE mg/dL
SPECIFIC GRAVITY, URINE: 1.011 (ref 1.005–1.030)
pH: 6 (ref 5.0–8.0)

## 2016-10-05 LAB — PROTEIN / CREATININE RATIO, URINE
Creatinine, Urine: 79 mg/dL
Protein Creatinine Ratio: 0.18 mg/mg{Cre} — ABNORMAL HIGH (ref 0.00–0.15)
TOTAL PROTEIN, URINE: 14 mg/dL

## 2016-10-05 LAB — CBC
HEMATOCRIT: 23.8 % — AB (ref 36.0–46.0)
Hemoglobin: 8.2 g/dL — ABNORMAL LOW (ref 12.0–15.0)
MCH: 27.2 pg (ref 26.0–34.0)
MCHC: 34.5 g/dL (ref 30.0–36.0)
MCV: 79.1 fL (ref 78.0–100.0)
PLATELETS: 285 10*3/uL (ref 150–400)
RBC: 3.01 MIL/uL — ABNORMAL LOW (ref 3.87–5.11)
RDW: 14 % (ref 11.5–15.5)
WBC: 10.3 10*3/uL (ref 4.0–10.5)

## 2016-10-05 LAB — COMPREHENSIVE METABOLIC PANEL
ALBUMIN: 3.1 g/dL — AB (ref 3.5–5.0)
ALT: 31 U/L (ref 14–54)
AST: 35 U/L (ref 15–41)
Alkaline Phosphatase: 100 U/L (ref 38–126)
Anion gap: 7 (ref 5–15)
BUN: 9 mg/dL (ref 6–20)
CHLORIDE: 104 mmol/L (ref 101–111)
CO2: 27 mmol/L (ref 22–32)
Calcium: 9.1 mg/dL (ref 8.9–10.3)
Creatinine, Ser: 0.57 mg/dL (ref 0.44–1.00)
GFR calc Af Amer: >60 mL/min (ref >60)
GFR calc non Af Amer: >60 mL/min (ref >60)
GLUCOSE: 72 mg/dL (ref 65–99)
POTASSIUM: 4.3 mmol/L (ref 3.5–5.1)
SODIUM: 138 mmol/L (ref 135–145)
Total Bilirubin: 0.5 mg/dL (ref 0.3–1.2)
Total Protein: 7 g/dL (ref 6.5–8.1)

## 2016-10-05 MED ORDER — HYDRALAZINE HCL 20 MG/ML IJ SOLN
10.0000 mg | Freq: Once | INTRAMUSCULAR | Status: AC | PRN
  Administered 2016-10-05: 13:00:00 via INTRAVENOUS
  Filled 2016-10-05: qty 1

## 2016-10-05 MED ORDER — LABETALOL HCL 200 MG PO TABS: 400.0000 mg | ORAL_TABLET | Freq: Two times a day (BID) | ORAL | 0 refills | Status: AC

## 2016-10-05 MED ORDER — DEXAMETHASONE SODIUM PHOSPHATE 10 MG/ML IJ SOLN: 10.0000 mg | Freq: Once | INTRAMUSCULAR | Status: DC

## 2016-10-05 MED ORDER — LABETALOL HCL 5 MG/ML IV SOLN
20.0000 mg | INTRAVENOUS | Status: AC | PRN
  Administered 2016-10-05: 20 mg via INTRAVENOUS
  Administered 2016-10-05: 80 mg via INTRAVENOUS
  Administered 2016-10-05: 40 mg via INTRAVENOUS
  Filled 2016-10-05: qty 8
  Filled 2016-10-05: qty 4
  Filled 2016-10-05: qty 16

## 2016-10-05 MED ORDER — LABETALOL HCL 100 MG PO TABS
600.0000 mg | ORAL_TABLET | Freq: Once | ORAL | Status: AC
  Administered 2016-10-05: 600 mg via ORAL
  Filled 2016-10-05: qty 6

## 2016-10-05 MED ORDER — METOCLOPRAMIDE HCL 5 MG/ML IJ SOLN: 10.0000 mg | Freq: Once | INTRAMUSCULAR | Status: DC

## 2016-10-05 MED ORDER — DIPHENHYDRAMINE HCL 50 MG/ML IJ SOLN: 25.0000 mg | Freq: Once | INTRAMUSCULAR | Status: DC

## 2016-10-05 MED ORDER — BUTALBITAL-APAP-CAFFEINE 50-325-40 MG PO TABS
2.0000 | ORAL_TABLET | Freq: Once | ORAL | Status: AC
  Administered 2016-10-05: 2 via ORAL
  Filled 2016-10-05: qty 2

## 2016-10-05 MED ORDER — LACTATED RINGERS IV SOLN: INTRAVENOUS | Status: DC

## 2016-10-05 NOTE — Discharge Instructions (Signed)
Hypertension During Pregnancy °Hypertension, commonly called high blood pressure, is when the force of blood pumping through your arteries is too strong. Arteries are blood vessels that carry blood from the heart throughout the body. Hypertension during pregnancy can cause problems for you and your baby. Your baby may be born early (prematurely) or may not weigh as much as he or she should at birth. Very bad cases of hypertension during pregnancy can be life-threatening. °Different types of hypertension can occur during pregnancy. These include: °· Chronic hypertension. This happens when: °¨ You have hypertension before pregnancy and it continues during pregnancy. °¨ You develop hypertension before you are [redacted] weeks pregnant, and it continues during pregnancy. °· Gestational hypertension. This is hypertension that develops after the 20th week of pregnancy. °· Preeclampsia, also called toxemia of pregnancy. This is a very serious type of hypertension that develops only during pregnancy. It affects the whole body, and it can be very dangerous for you and your baby. °Gestational hypertension and preeclampsia usually go away within 6 weeks after your baby is born. Women who have hypertension during pregnancy have a greater chance of developing hypertension later in life or during future pregnancies. °What are the causes? °The exact cause of hypertension is not known. °What increases the risk? °There are certain factors that make it more likely for you to develop hypertension during pregnancy. These include: °· Having hypertension during a previous pregnancy or prior to pregnancy. °· Being overweight. °· Being older than age 40. °· Being pregnant for the first time or being pregnant with more than one baby. °· Becoming pregnant using fertilization methods such as IVF (in vitro fertilization). °· Having diabetes, kidney problems, or systemic lupus erythematosus. °· Having a family history of hypertension. °What are the  signs or symptoms? °Chronic hypertension and gestational hypertension rarely cause symptoms. Preeclampsia causes symptoms, which may include: °· Increased protein in your urine. Your health care provider will check for this at every visit before you give birth (prenatal visit). °· Severe headaches. °· Sudden weight gain. °· Swelling of the hands, face, legs, and feet. °· Nausea and vomiting. °· Vision problems, such as blurred or double vision. °· Numbness in the face, arms, legs, and feet. °· Dizziness. °· Slurred speech. °· Sensitivity to bright lights. °· Abdominal pain. °· Convulsions. °How is this diagnosed? °You may be diagnosed with hypertension during a routine prenatal exam. At each prenatal visit, you may: °· Have a urine test to check for high amounts of protein in your urine. °· Have your blood pressure checked. A blood pressure reading is recorded as two numbers, such as "120 over 80" (or 120/80). The first ("top") number is called the systolic pressure. It is a measure of the pressure in your arteries when your heart beats. The second ("bottom") number is called the diastolic pressure. It is a measure of the pressure in your arteries as your heart relaxes between beats. Blood pressure is measured in a unit called mm Hg. A normal blood pressure reading is: °¨ Systolic: below 120. °¨ Diastolic: below 80. °The type of hypertension that you are diagnosed with depends on your test results and when your symptoms developed. °· Chronic hypertension is usually diagnosed before 20 weeks of pregnancy. °· Gestational hypertension is usually diagnosed after 20 weeks of pregnancy. °· Hypertension with high amounts of protein in the urine is diagnosed as preeclampsia. °· Blood pressure measurements that stay above 160 systolic, or above 110 diastolic, are signs of severe preeclampsia. °  How is this treated? Treatment for hypertension during pregnancy varies depending on the type of hypertension you have and how  serious it is.  If you take medicines called ACE inhibitors to treat chronic hypertension, you may need to switch medicines. ACE inhibitors should not be taken during pregnancy.  If you have gestational hypertension, you may need to take blood pressure medicine.  If you are at risk for preeclampsia, your health care provider may recommend that you take a low-dose aspirin every day to prevent high blood pressure during your pregnancy.  If you have severe preeclampsia, you may need to be hospitalized so you and your baby can be monitored closely. You may also need to take medicine (magnesium sulfate) to prevent seizures and to lower blood pressure. This medicine may be given as an injection or through an IV tube.  In some cases, if your condition gets worse, you may need to deliver your baby early. Follow these instructions at home: Eating and drinking   Drink enough fluid to keep your urine clear or pale yellow.  Eat a healthy diet that is low in salt (sodium). Do not add salt to your food. Check food labels to see how much sodium a food or beverage contains. Lifestyle   Do not use any products that contain nicotine or tobacco, such as cigarettes and e-cigarettes. If you need help quitting, ask your health care provider.  Do not use alcohol.  Avoid caffeine.  Avoid stress as much as possible. Rest and get plenty of sleep. General instructions   Take over-the-counter and prescription medicines only as told by your health care provider.  While lying down, lie on your left side. This keeps pressure off your baby.  While sitting or lying down, raise (elevate) your feet. Try putting some pillows under your lower legs.  Exercise regularly. Ask your health care provider what kinds of exercise are best for you.  Keep all prenatal and follow-up visits as told by your health care provider. This is important. Contact a health care provider if:  You have symptoms that your health care  provider told you may require more treatment or monitoring, such as:  Fever.  Vomiting.  Headache. Get help right away if:  You have severe abdominal pain or vomiting that does not get better with treatment.  You suddenly develop swelling in your hands, ankles, or face.  You gain 4 lbs (1.8 kg) or more in 1 week.  You develop vaginal bleeding, or you have blood in your urine.  You do not feel your baby moving as much as usual.  You have blurred or double vision.  You have muscle twitching or sudden tightening (spasms).  You have shortness of breath.  Your lips or fingernails turn blue. This information is not intended to replace advice given to you by your health care provider. Make sure you discuss any questions you have with your health care provider. Document Released: 03/11/2011 Document Revised: 01/11/2016 Document Reviewed: 12/07/2015 Elsevier Interactive Patient Education  2017 Elsevier Inc.       General Headache Without Cause A headache is pain or discomfort felt around the head or neck area. There are many causes and types of headaches. In some cases, the cause may not be found. Follow these instructions at home: Managing pain   Take over-the-counter and prescription medicines only as told by your doctor.  Lie down in a dark, quiet room when you have a headache.  If directed, apply ice to the  head and neck area:  Put ice in a plastic bag.  Place a towel between your skin and the bag.  Leave the ice on for 20 minutes, 2-3 times per day.  Use a heating pad or hot shower to apply heat to the head and neck area as told by your doctor.  Keep lights dim if bright lights bother you or make your headaches worse. Eating and drinking   Eat meals on a regular schedule.  Lessen how much alcohol you drink.  Lessen how much caffeine you drink, or stop drinking caffeine. General instructions   Keep all follow-up visits as told by your doctor. This is  important.  Keep a journal to find out if certain things bring on headaches. For example, write down:  What you eat and drink.  How much sleep you get.  Any change to your diet or medicines.  Relax by getting a massage or doing other relaxing activities.  Lessen stress.  Sit up straight. Do not tighten (tense) your muscles.  Do not use tobacco products. This includes cigarettes, chewing tobacco, or e-cigarettes. If you need help quitting, ask your doctor.  Exercise regularly as told by your doctor.  Get enough sleep. This often means 7-9 hours of sleep. Contact a doctor if:  Your symptoms are not helped by medicine.  You have a headache that feels different than the other headaches.  You feel sick to your stomach (nauseous) or you throw up (vomit).  You have a fever. Get help right away if:  Your headache becomes really bad.  You keep throwing up.  You have a stiff neck.  You have trouble seeing.  You have trouble speaking.  You have pain in the eye or ear.  Your muscles are weak or you lose muscle control.  You lose your balance or have trouble walking.  You feel like you will pass out (faint) or you pass out.  You have confusion. This information is not intended to replace advice given to you by your health care provider. Make sure you discuss any questions you have with your health care provider. Document Released: 04/01/2008 Document Revised: 11/29/2015 Document Reviewed: 10/16/2014 Elsevier Interactive Patient Education  2017 [employer].

## 2016-10-05 NOTE — MAU Provider Note (Signed)
History     CSN: [id number]  Arrival date and time: 10/05/16 1035   First Provider Initiated Contact with Patient 10/05/16 1118      Chief Complaint  Patient presents with  . Headache  . Hypertension   HPI Jill Sparks is a 31 y.o. [phone] female who presents 5 days s/p SVD with complaints of headache & hypertension. Reports hx of postpartum hypertension in previous pregnancy; was put on medication but didn't take it b/c she felt the medication was making her worse and hypertension resolved after a few weeks.  Constant headache since yesterday. Feels like "brain is being squeezed". Rates pain 10/10. Nothing makes better or worse. Has been alternating tylenol & ibuprofen without relief. Endorses some blurred vision yesterday. Denies chest pain, SOB, epigastric pain, or fever.   OB History    Gravida Para Term Preterm AB Living   SAB TAB Ectopic Multiple Live Births         0 1      Past Medical History:  Diagnosis Date  . Hypertension    on meds prior to pregnancy    No past surgical history on file.  No family history on file.  Social History  Substance Use Topics  . Smoking status: Never Smoker  . Smokeless tobacco: Never Used  . Alcohol use No    Allergies: No Known Allergies  Prescriptions Prior to Admission  Medication Sig Dispense Refill Last Dose  . ibuprofen (ADVIL,MOTRIN) 600 MG tablet Take 1 tablet (600 mg total) by mouth every 6 (six) hours. 30 tablet 0 10/05/2016 at 1000  . acetaminophen (TYLENOL) 500 MG tablet Take 1,000 mg by mouth every 6 (six) hours as needed for mild pain or headache.   09/29/2016 at Unknown time  . Prenatal Vit-Fe Fumarate-FA (PRENATAL MULTIVITAMIN) TABS tablet Take 1 tablet by mouth daily at 12 noon.   Unknown at Unknown time    Review of Systems  Constitutional: Negative.   Eyes: Positive for visual disturbance.  Respiratory: Negative for shortness of breath.   Cardiovascular: Negative for chest pain.   Gastrointestinal: Negative.   Genitourinary: Positive for vaginal discharge.  Neurological: Positive for headaches. Negative for dizziness.   Physical Exam   Blood pressure (!) 143/78, pulse 66, temperature 97.9 F (36.6 C), temperature source Oral, resp. rate 18, not currently breastfeeding.  Temp:  [97.9 F (36.6 C)-98.2 F (36.8 C)] 97.9 F (36.6 C) (04/01 1339) Pulse Rate:  [52-73] 66 (04/01 1341) Resp:  [16-18] 18 (04/01 1340) BP: (143-179)/(78-120) 143/78 (04/01 1341)   Physical Exam  Nursing note and vitals reviewed. Constitutional: She is oriented to person, place, and time. She appears well-developed and well-nourished. No distress.  HENT:  Head: Normocephalic and atraumatic.  Eyes: Conjunctivae are normal. Right eye exhibits no discharge. Left eye exhibits no discharge. No scleral icterus.  Neck: Normal range of motion.  Cardiovascular: Normal rate, regular rhythm and normal heart sounds.   No murmur heard. Respiratory: Effort normal and breath sounds normal. No respiratory distress. She has no wheezes.  GI: Soft. There is no tenderness.  Musculoskeletal: She exhibits no edema.  Neurological: She is alert and oriented to person, place, and time. She has normal reflexes.  No clonus  Skin: Skin is warm and dry. She is not diaphoretic.  Psychiatric: She has a normal mood and affect. Her behavior is normal. Judgment and thought content normal.    MAU Course  Procedures Results for orders  placed or performed during the hospital encounter of 10/05/16 (from the past 24 hour(s))  Protein / creatinine ratio, urine     Status: Abnormal   Collection Time: 10/05/16 11:28 AM  Result Value Ref Range   Creatinine, Urine 79.00 mg/dL   Total Protein, Urine 14 mg/dL   Protein Creatinine Ratio 0.18 (H) 0.00 - 0.15 mg/mg[Cre]  Urinalysis, Routine w reflex microscopic     Status: Abnormal   Collection Time: 10/05/16 11:28 AM  Result Value Ref Range   Color, Urine YELLOW YELLOW    APPearance CLEAR CLEAR   Specific Gravity, Urine 1.011 1.005 - 1.030   pH 6.0 5.0 - 8.0   Glucose, UA NEGATIVE NEGATIVE mg/dL   Hgb urine dipstick MODERATE (A) NEGATIVE   Bilirubin Urine NEGATIVE NEGATIVE   Ketones, ur NEGATIVE NEGATIVE mg/dL   Protein, ur NEGATIVE NEGATIVE mg/dL   Nitrite NEGATIVE NEGATIVE   Leukocytes, UA MODERATE (A) NEGATIVE   RBC / HPF 6-30 0 - 5 RBC/hpf   WBC, UA 6-30 0 - 5 WBC/hpf   Bacteria, UA RARE (A) NONE SEEN   Squamous Epithelial / LPF 0-5 (A) NONE SEEN   Mucous PRESENT   Comprehensive metabolic panel     Status: Abnormal   Collection Time: 10/05/16 11:44 AM  Result Value Ref Range   Sodium 138 135 - 145 mmol/L   Potassium 4.3 3.5 - 5.1 mmol/L   Chloride 104 101 - 111 mmol/L   CO2 27 22 - 32 mmol/L   Glucose, Bld 72 65 - 99 mg/dL   BUN 9 6 - 20 mg/dL   Creatinine, Ser 4.09 0.44 - 1.00 mg/dL   Calcium 9.1 8.9 - 81.1 mg/dL   Total Protein 7.0 6.5 - 8.1 g/dL   Albumin 3.1 (L) 3.5 - 5.0 g/dL   AST 35 15 - 41 U/L   ALT 31 14 - 54 U/L   Alkaline Phosphatase 100 38 - 126 U/L   Total Bilirubin 0.5 0.3 - 1.2 mg/dL   GFR calc non Af Amer >60 >60 mL/min   GFR calc Af Amer >60 >60 mL/min   Anion gap 7 5 - 15  CBC     Status: Abnormal   Collection Time: 10/05/16 11:44 AM  Result Value Ref Range   WBC 10.3 4.0 - 10.5 K/uL   RBC 3.01 (L) 3.87 - 5.11 MIL/uL   Hemoglobin 8.2 (L) 12.0 - 15.0 g/dL   HCT 91.4 (L) 78.2 - 95.6 %   MCV 79.1 78.0 - 100.0 fL   MCH 27.2 26.0 - 34.0 pg   MCHC 34.5 30.0 - 36.0 g/dL   RDW 21.3 08.6 - 57.8 %   Platelets 285 150 - 400 K/uL   MDM CBC, CMP, urine PCR --- discussed need for cath u/a, pt would like to wait for results before agreeing to cath Labetalol protocol ordered for severe range BPs Fioricet 2 tabs -- pain down to 6/10 from 10/10 Labetalol x 3 doses (20, 40, 80) & hydralazine 10 mg IV S/w Dr. Emelda Fear. Will give labetalol 600 mg PO Patient reports headache improved to 4/10 & BPs out of severe range Discussed  plan with Dr. Emelda Fear. Will discharge home on labetalol 400 mg BID (TID if headache returns) -- pt to have BP check in office on Wednesday  Assessment and Plan  A: 1. Postpartum hypertension   2. Acute nonintractable headache, unspecified headache type    P: Discharge home Rx labetalol Discussed reasons to return to MAU Go  to [facility] Wednesday morning for BP check  Judeth Horn 10/05/2016, 11:18 AM

## 2016-10-05 NOTE — MAU Note (Signed)
Has a migraine.  Has had them  Before when her BP was high.  Delivered vag on 3/27. Some blurring, denies increase in swelling or epigastric pain.

## 2016-10-05 NOTE — Progress Notes (Addendum)
PP delivered March 27. Has hx of chronic HTN dx 4 years ago. States was on HTN meds but states "made it worse". Pt released or discharged from hospital 3/28. Ha started yesterday 2100. Ibuprofen tylenol taken before falling asleep. Woke up unchanged so came to [facility] triage.  1120: Provider at bs assessing pt.   Orders received for IV insertion, LR, CBC and CMP and cath urine specimen if needed.   1245: Pt stating starting to feel better. Ha pain now 7/10.  Bedpan placed under buttocks. Pt voided 800 bloody dt PP 2 days ago  1251: crackers given with drink.   1256: pt tolerating drink and crackers. Ha 5 now. Will cont to monitor.   1326: resting quietly with eyes closed.   1345: pain tolerable and pt states able to drive home without problems.   1412: Discharge instructions given with pt understanding.   1415: Pt left home via ambulatory.

## 2016-10-06 MED FILL — LABETALOL HCL 200 MG TABLET: 200 | 20 days supply | Qty: 120 | Fill #0

## 2016-10-08 ENCOUNTER — Telehealth: Payer: Self-pay | Admitting: *Deleted

## 2016-10-08 NOTE — Telephone Encounter (Signed)
Called pt to discuss plan of care. I asked if she could come in earlier than 4pm for nurse visit.  She agred to come in @ 3pm.  She states that she has been having mild headaches which are manageable. Because of the headaches she has been taking the Labetalol 3 times daily as prescribed. She was able to obtain the medication with help from the hospital social worker because her insurance did not cover it. I advised pt that we will further evaluate her medication dose tomorrow when she comes in for BP check.  She voiced understanding.

## 2016-10-09 ENCOUNTER — Telehealth: Payer: Self-pay | Admitting: *Deleted

## 2016-10-09 ENCOUNTER — Ambulatory Visit: Payer: Self-pay

## 2016-10-09 NOTE — Telephone Encounter (Signed)
Discussed patient with Dr. Adrian Blackwater, he doesn't recommend any changes at this time. Patient should followup for a PP visit. Message to front desk to schedule.

## 2016-10-09 NOTE — Telephone Encounter (Signed)
Pts nurse from the health department went by her house today to do a blood pressure check so patient requested that she inform us of the results so that patient doesn't need to come to office for BP check. Patients blood pressure today is 136/90. Pt is currently taking labetalol 400 mg tid. She is not having any headaches or blurry vision.  Nurse will be returning on Monday to repeat blood pressure. Advised her that I would report blood pressure to an MD and call patient if any recommendations or changes are made.

## 2016-10-15 ENCOUNTER — Encounter (HOSPITAL_COMMUNITY): Payer: Self-pay | Admitting: *Deleted

## 2016-10-15 ENCOUNTER — Inpatient Hospital Stay (HOSPITAL_COMMUNITY)
Admission: AD | Admit: 2016-10-15 | Discharge: 2016-10-15 | Disposition: A | Discharge status: Home / Self Care | Payer: Medicaid Other | Source: Ambulatory Visit | Attending: Family Medicine | Admitting: Family Medicine

## 2016-10-15 DIAGNOSIS — Z79899 Other long term (current) drug therapy: Secondary | ICD-10-CM | POA: Diagnosis not present

## 2016-10-15 DIAGNOSIS — K5900 Constipation, unspecified: Secondary | ICD-10-CM | POA: Diagnosis not present

## 2016-10-15 DIAGNOSIS — R300 Dysuria: Secondary | ICD-10-CM | POA: Diagnosis not present

## 2016-10-15 DIAGNOSIS — R1032 Left lower quadrant pain: Secondary | ICD-10-CM | POA: Diagnosis not present

## 2016-10-15 DIAGNOSIS — K5909 Other constipation: Secondary | ICD-10-CM | POA: Insufficient documentation

## 2016-10-15 LAB — CBC WITH DIFFERENTIAL/PLATELET
BASOS ABS: 0 10*3/uL (ref 0.0–0.1)
Basophils Relative: 0 %
EOS PCT: 1 %
Eosinophils Absolute: 0.1 10*3/uL (ref 0.0–0.7)
HEMATOCRIT: 29 % — AB (ref 36.0–46.0)
HEMOGLOBIN: 9.9 g/dL — AB (ref 12.0–15.0)
LYMPHS ABS: 3.2 10*3/uL (ref 0.7–4.0)
Lymphocytes Relative: 35 %
MCH: 26.8 pg (ref 26.0–34.0)
MCHC: 34.1 g/dL (ref 30.0–36.0)
MCV: 78.4 fL (ref 78.0–100.0)
Monocytes Absolute: 0.3 10*3/uL (ref 0.1–1.0)
Monocytes Relative: 3 %
Neutro Abs: 5.5 10*3/uL (ref 1.7–7.7)
Neutrophils Relative %: 61 %
Platelets: 429 10*3/uL — ABNORMAL HIGH (ref 150–400)
RBC: 3.7 MIL/uL — AB (ref 3.87–5.11)
RDW: 13.7 % (ref 11.5–15.5)
WBC: 9.1 10*3/uL (ref 4.0–10.5)

## 2016-10-15 LAB — URINALYSIS, ROUTINE W REFLEX MICROSCOPIC
BILIRUBIN URINE: NEGATIVE
Glucose, UA: NEGATIVE mg/dL
HGB URINE DIPSTICK: NEGATIVE
Ketones, ur: NEGATIVE mg/dL
Leukocytes, UA: NEGATIVE
Nitrite: NEGATIVE
Protein, ur: NEGATIVE mg/dL
SPECIFIC GRAVITY, URINE: 1.011 (ref 1.005–1.030)
pH: 5 (ref 5.0–8.0)

## 2016-10-15 MED ORDER — FLEET ENEMA 7-19 GM/118ML RE ENEM
1.0000 | ENEMA | Freq: Once | RECTAL | Status: AC
  Administered 2016-10-15: 1 via RECTAL

## 2016-10-15 NOTE — Discharge Instructions (Signed)
Sodium Phosphate Monobasic; Sodium Phosphate Dibasic enema What is this medicine? SODIUM PHOSPATE SALT (SOE dee um FOS fate sawlt) is a saline laxative. It is used to treat constipation or to clean the bowel before a colonoscopy. This medicine may be used for other purposes; ask your health care provider or pharmacist if you have questions. COMMON BRAND NAME(S): Fleet, Ready To Use Saline What should I tell my health care provider before I take this medicine? They need to know if you have any of these conditions: -abnormal blood levels of electrolytes like sodium, phosphate, potassium or calcium -bowel problems like colitis, constipation, and obstruction -change in bowel habits lasting more than 2 weeks -chest pain -dehydration -heart failure -kidney disease -on low salt or sodium diet -stomach pain, nausea, vomiting -an unusual or allergic reaction to sodium phosphate, other medicines, foods, dyes, or preservatives -pregnant or trying to get pregnant -breast-feeding How should I use this medicine? This medicine is for rectal use only. Do not take by mouth. Follow the directions on the prescription label. Wash your hands before and after use. Remove tip from enema bottle. Gently insert enema tip into the rectum. Squeeze bottle until almost all of the medicine is inside the rectum. Remove enema tip from the rectum and stay in position until the urge to evacuate is strong. Do not take doses that are larger than those recommended on the product label or otherwise directed by your healthcare professional. Do not take more than one dose in 24 hours. Talk to your pediatrician regarding the use of this medicine in children. While this drug may be prescribed for children as young as 68 years old for selected conditions, precautions do apply. Overdosage: If you think you have taken too much of this medicine contact a poison control center or emergency room at once. NOTE: This medicine is only for you.  Do not share this medicine with others. What if I miss a dose? This does not apply; this medicine is not for regular use. What may interact with this medicine? -aspirin -certain medicines used to treat high blood pressure, like captopril, enalapril, lisinopril, or candesartan, losartan, valsartan -diuretics -NSAIDS, medicines for pain and inflammation, like ibuprofen or naproxen This list may not describe all possible interactions. Give your health care provider a list of all the medicines, herbs, non-prescription drugs, or dietary supplements you use. Also tell them if you smoke, drink alcohol, or use illegal drugs. Some items may interact with your medicine. What should I watch for while using this medicine? Do not use with any other laxatives unless your doctor tells you to. Drink fluids as directed to prevent dehydration. See your doctor right away if you do not have a bowel movement after using this medicine. What side effects may I notice from receiving this medicine? Side effects that you should report to your doctor or health care professional as soon as possible: -allergic reactions like skin rash, itching or hives, swelling of the face, lips, or tongue -irregular heart beat -rectal bleeding -seizures Side effects that usually do not require medical attention (report to your doctor or health care professional if they continue or are bothersome): -bloating -dizziness -headache -nausea and vomiting -stomach pain This list may not describe all possible side effects. Call your doctor for medical advice about side effects. You may report side effects to FDA at 1-800-FDA-1088. Where should I keep my medicine? Keep out of the reach of children. Store at room temperature between 15 and 30 degrees C (59  and 86 degrees F). Throw away any unused medicine after the expiration date. NOTE: This sheet is a summary. It may not cover all possible information. If you have questions about this  medicine, talk to your doctor, pharmacist, or health care provider.  2018 Elsevier/Gold Standard (2012-07-15 14:54:06)

## 2016-10-15 NOTE — MAU Note (Signed)
Pt C/O pain with urination, has constant LLQ pain for the last 3 days.  Vag delivery on 3/27.  Has occasional spotting.

## 2016-10-15 NOTE — MAU Provider Note (Signed)
History     CSN: [id number]  Arrival date and time: 10/15/16 1203   First Provider Initiated Contact with Patient 10/15/16 1322      Chief Complaint  Patient presents with  . Abdominal Pain  . Dysuria   Abdominal Pain  This is a new problem. The current episode started in the past 7 days. The onset quality is sudden. The problem occurs constantly. The most recent episode lasted 4 days. The problem has been unchanged. The pain is located in the LLQ. The pain is at a severity of 7/10. The pain is moderate. The quality of the pain is sharp. Pain radiation: LT groin. Associated symptoms include anorexia ("eats mostly bread/nausea makes me not want to eat"), constipation ("not going as usual x 1 wk"), dysuria (started yesterday) and nausea ("lying down improves it"). The pain is aggravated by movement and palpation. The pain is relieved by nothing. Treatments tried: ibuprofen and MOM. The treatment provided no relief. recent vaginal delivery  Dysuria   This is a new problem. The current episode started yesterday. The problem has been unchanged. The quality of the pain is described as aching. The pain is at a severity of 5/10. The pain is mild. There has been no fever. She is not sexually active. There is no history of pyelonephritis. Associated symptoms include nausea ("lying down improves it"). She has tried nothing for the symptoms. The treatment provided no relief. recent vaginal delivery    Past Medical History:  Diagnosis Date  . Hypertension    on meds prior to pregnancy    History reviewed. No pertinent surgical history.  History reviewed. No pertinent family history.  Social History  Substance Use Topics  . Smoking status: Never Smoker  . Smokeless tobacco: Never Used  . Alcohol use No    Allergies: No Known Allergies  Prescriptions Prior to Admission  Medication Sig Dispense Refill Last Dose  . acetaminophen (TYLENOL) 500 MG tablet Take 1,000 mg by mouth every 6 (six)  hours as needed for mild pain or headache.   10/04/2016 at Unknown time  . ibuprofen (ADVIL,MOTRIN) 600 MG tablet Take 1 tablet (600 mg total) by mouth every 6 (six) hours. 30 tablet 0 10/05/2016 at 1000  . labetalol (NORMODYNE) 200 MG tablet Take 2 tablets (400 mg total) by mouth 2 (two) times daily. Increase to 400 mg three times daily if headache returns 120 tablet 0     Review of Systems  Constitutional: Negative.   HENT: Negative.   Eyes: Negative.   Respiratory: Negative.   Cardiovascular: Negative.   Gastrointestinal: Positive for abdominal pain, anorexia ("eats mostly bread/nausea makes me not want to eat"), constipation ("not going as usual x 1 wk") and nausea ("lying down improves it").  Endocrine: Negative.   Genitourinary: Positive for dysuria (started yesterday).  Musculoskeletal: Negative.   Skin: Negative.   Allergic/Immunologic: Negative.   Neurological: Negative.   Hematological: Negative.   Psychiatric/Behavioral: Negative.    Results for orders placed or performed during the hospital encounter of 10/15/16 (from the past 24 hour(s))  Urinalysis, Routine w reflex microscopic     Status: None   Collection Time: 10/15/16 12:21 PM  Result Value Ref Range   Color, Urine YELLOW YELLOW   APPearance CLEAR CLEAR   Specific Gravity, Urine 1.011 1.005 - 1.030   pH 5.0 5.0 - 8.0   Glucose, UA NEGATIVE NEGATIVE mg/dL   Hgb urine dipstick NEGATIVE NEGATIVE   Bilirubin Urine NEGATIVE NEGATIVE   Ketones, ur  NEGATIVE NEGATIVE mg/dL   Protein, ur NEGATIVE NEGATIVE mg/dL   Nitrite NEGATIVE NEGATIVE   Leukocytes, UA NEGATIVE NEGATIVE   Physical Exam   Blood pressure 122/79, pulse 85, temperature 98.2 F (36.8 C), temperature source Oral, resp. rate 18, not currently breastfeeding - formula feeding.  Physical Exam  Constitutional: She is oriented to person, place, and time. She appears well-developed and well-nourished.  HENT:  Head: Normocephalic.  Eyes: Pupils are equal,  round, and reactive to light.  Neck: Normal range of motion. Neck supple.  Cardiovascular: Normal rate, regular rhythm, normal heart sounds and intact distal pulses.   Respiratory: Effort normal and breath sounds normal.  GI: Soft. Bowel sounds are normal. She exhibits no mass. There is tenderness. There is guarding (with palpation). There is no rebound.  Genitourinary: Vagina normal.  Genitourinary Comments: U-5 / LLQ very tender with bimanual / No CMT  Musculoskeletal: Normal range of motion.  Neurological: She is alert and oriented to person, place, and time. She has normal reflexes.  Skin: Skin is warm and dry.  Psychiatric: Her behavior is normal. Judgment and thought content normal.  Flat affect    MAU Course  Procedures  MDM CCUA - negative for UTI / no UCx sent CBC with Diff Fleet enema - to self administer at home d/t patient here with toddler and newborn  Assessment and Plan  31 y.o. [id number] 12 days Postpartum Constipation, unspecified constipation type Dysuria   Discharge home Instructions on constipation and administration of Fleet Enema given Advised to purchase Colace and Miralax OTC and take as directed on packaging Educated to increase fiber intake and continue drinking 8-10 bottle of water daily Patient verbalized an understanding of the plan of care and agrees.  Raelyn Mora, M MSN, CNM 10/15/2016, 1:29 PM

## 2016-10-16 ENCOUNTER — Encounter (HOSPITAL_COMMUNITY): Payer: Self-pay | Admitting: *Deleted

## 2016-10-16 ENCOUNTER — Inpatient Hospital Stay (HOSPITAL_COMMUNITY)
Admission: AD | Admit: 2016-10-16 | Discharge: 2016-10-16 | Disposition: A | Discharge status: Home / Self Care | Payer: Medicaid Other | Source: Ambulatory Visit | Attending: Obstetrics and Gynecology | Admitting: Obstetrics and Gynecology

## 2016-10-16 DIAGNOSIS — Z79899 Other long term (current) drug therapy: Secondary | ICD-10-CM | POA: Insufficient documentation

## 2016-10-16 DIAGNOSIS — O8612 Endometritis following delivery: Secondary | ICD-10-CM | POA: Diagnosis not present

## 2016-10-16 LAB — URINALYSIS, ROUTINE W REFLEX MICROSCOPIC
BILIRUBIN URINE: NEGATIVE
Bacteria, UA: NONE SEEN
GLUCOSE, UA: NEGATIVE mg/dL
Hgb urine dipstick: NEGATIVE
Ketones, ur: NEGATIVE mg/dL
NITRITE: NEGATIVE
PH: 8 (ref 5.0–8.0)
Protein, ur: NEGATIVE mg/dL
SPECIFIC GRAVITY, URINE: 1.011 (ref 1.005–1.030)

## 2016-10-16 LAB — CBC WITH DIFFERENTIAL/PLATELET
Basophils Absolute: 0 10*3/uL (ref 0.0–0.1)
Basophils Relative: 0 %
EOS ABS: 0.2 10*3/uL (ref 0.0–0.7)
Eosinophils Relative: 2 %
HEMATOCRIT: 30.1 % — AB (ref 36.0–46.0)
HEMOGLOBIN: 10.1 g/dL — AB (ref 12.0–15.0)
LYMPHS ABS: 2.6 10*3/uL (ref 0.7–4.0)
Lymphocytes Relative: 23 %
MCH: 26.4 pg (ref 26.0–34.0)
MCHC: 33.6 g/dL (ref 30.0–36.0)
MCV: 78.6 fL (ref 78.0–100.0)
MONOS PCT: 4 %
Monocytes Absolute: 0.5 10*3/uL (ref 0.1–1.0)
NEUTROS ABS: 8.3 10*3/uL — AB (ref 1.7–7.7)
NEUTROS PCT: 71 %
Platelets: 455 10*3/uL — ABNORMAL HIGH (ref 150–400)
RBC: 3.83 MIL/uL — ABNORMAL LOW (ref 3.87–5.11)
RDW: 13.7 % (ref 11.5–15.5)
WBC: 11.6 10*3/uL — ABNORMAL HIGH (ref 4.0–10.5)

## 2016-10-16 LAB — WET PREP, GENITAL
SPERM: NONE SEEN
Trich, Wet Prep: NONE SEEN
Yeast Wet Prep HPF POC: NONE SEEN

## 2016-10-16 MED ORDER — AMOXICILLIN-POT CLAVULANATE 875-125 MG PO TABS
1.0000 | ORAL_TABLET | Freq: Once | ORAL | Status: AC
  Administered 2016-10-16: 1 via ORAL
  Filled 2016-10-16: qty 1

## 2016-10-16 MED ORDER — AMOXICILLIN-POT CLAVULANATE 875-125 MG PO TABS: 1.0000 | ORAL_TABLET | Freq: Two times a day (BID) | ORAL | 0 refills | Status: AC

## 2016-10-16 MED ORDER — IBUPROFEN 800 MG PO TABS
800.0000 mg | ORAL_TABLET | Freq: Once | ORAL | Status: AC
  Administered 2016-10-16: 800 mg via ORAL
  Filled 2016-10-16: qty 1

## 2016-10-16 MED ORDER — IBUPROFEN 800 MG PO TABS: 800.0000 mg | ORAL_TABLET | Freq: Three times a day (TID) | ORAL | 0 refills | Status: AC | PRN

## 2016-10-16 NOTE — Discharge Instructions (Signed)
Endometritis °Endometritis is irritation, soreness, or inflammation that affects the lining of the uterus (endometrium). °Infection is usually the cause of endometritis. It is important to get treatment to prevent complications. Common complications may include more severe infections and not being able to have children(infertility). °What are the causes? °This condition may be caused by: °· Bacterial infections. °· STIs (sexually transmitted infections). °· A miscarriage or childbirth, especially after a long labor or cesarean delivery. °· Certain gynecological procedures. These may include dilation and curettage (D&C), hysteroscopy, or birth control (contraceptive) insertion. °· Tuberculosis (TB). ° °What are the signs or symptoms? °Symptoms of this condition include: °· Fever. °· Lower abdomen (abdominal) pain. °· Pelvis (pelvic) pain. °· Abnormal vaginal discharge or bleeding. °· Abdominal bloating (distention) or swelling. °· General discomfort or generally feeling ill. °· Discomfort with bowel movements. °· Constipation. ° °How is this diagnosed? °This condition may be diagnosed based on: °· A physical exam, including a pelvic exam. °· Tests, such as: °? Blood tests. °? Removal of a sample of endometrial tissue for testing (endometrial biopsy). °? Examining a sample of vaginal discharge under a microscope (wet prep). °? Removal of a sample of fluid from the cervix for testing (cervical culture). °? Surgical examination of the pelvis and abdomen. ° °How is this treated? °This condition is treated with: °· Antibiotic medicines. °· For more severe cases, hospitalization may be needed to give fluids and antibiotics directly into a vein through an IV tube. ° °Follow these instructions at home: °· Take over-the-counter and prescription medicines only as told by your health care provider. °· Drink enough fluid to keep your urine clear or pale yellow. °· Take your antibiotic medicine as told by your health care  provider. Do not stop taking the antibiotic even if you start to feel better. °· Do not douche or have sex (including vaginal, oral, and anal sex) until your health care provider approves. °· If your endometritis was caused by an STI, do not have sex (including vaginal, oral, and anal sex) until your partner has also been treated for the STI. °· Return to your normal activities as told by your health care provider. Ask your health care provider what activities are safe for you. °· Keep all follow-up visits as told by your health care provider. This is important. °Contact a health care provider if: °· You have pain that does not get better with medicine. °· You have a fever. °· You have pain with bowel movements. °Get help right away if: °· You have abdominal swelling. °· You have abdominal pain that gets worse. °· You have bad-smelling vaginal discharge, or an increased amount of vaginal discharge. °· You have abnormal vaginal bleeding. °· You have nausea and vomiting. °Summary °· Endometritis affects the lining of the uterus (endometrium) and is usually caused by an infection. °· It is important to get treatment to prevent complications. °· You have several treatment options for endometritis. Treatment may include antibiotics and IV fluids. °· Take your antibiotic medicine as told by your health care provider. Do not stop taking the antibiotic even if you start to feel better. °· Do not douche or have sex (including vaginal, oral, and anal sex) until your health care provider approves. °This information is not intended to replace advice given to you by your health care provider. Make sure you discuss any questions you have with your health care provider. °Document Released: 06/17/2001 Document Revised: 07/08/2016 Document Reviewed: 07/08/2016 °Elsevier Interactive Patient Education © 2017   [employer] ° °

## 2016-10-16 NOTE — MAU Provider Note (Signed)
History     CSN: [id number]  Arrival date and time: 10/16/16 1640   First Provider Initiated Contact with Patient 10/16/16 1808      Chief Complaint  Patient presents with  . Abdominal Pain   Non-pregnant female 2 weeks postpartum from SVD here with worsening LLQ and vaginal pain. Abdominal pain started 4 days ago but vaginal pain started today. She cannot describe the pain, "it hurts" and rates 9/10. She has not used anything for the pain today. She denies fevers but reports hot flashes. She was seen yesterday in MAU for the abdominal pain and treated for constipation. She has used MOM twice and had several BMs today. She continues to have some lochia. She also reports pain with urination x2 days.   Past Medical History:  Diagnosis Date  . Hypertension    on meds prior to pregnancy    Past Surgical History:  Procedure Laterality Date  . NO PAST SURGERIES      No family history on file.  Social History  Substance Use Topics  . Smoking status: Never Smoker  . Smokeless tobacco: Never Used  . Alcohol use No    Allergies: No Known Allergies  Prescriptions Prior to Admission  Medication Sig Dispense Refill Last Dose  . acetaminophen (TYLENOL) 500 MG tablet Take 1,000 mg by mouth every 6 (six) hours as needed for mild pain or headache.   10/14/2016 at Unknown time  . ibuprofen (ADVIL,MOTRIN) 600 MG tablet Take 1 tablet (600 mg total) by mouth every 6 (six) hours. 30 tablet 0 10/15/2016 at Unknown time  . labetalol (NORMODYNE) 200 MG tablet Take 2 tablets (400 mg total) by mouth 2 (two) times daily. Increase to 400 mg three times daily if headache returns 120 tablet 0 10/15/2016 at 0800    Review of Systems  Constitutional: Negative for fever.  Gastrointestinal: Positive for abdominal pain and nausea. Negative for constipation.  Genitourinary: Positive for dysuria and vaginal bleeding.   Physical Exam   Blood pressure 124/76, pulse 86, temperature 98.8 F (37.1 C), resp.  rate 18, not currently breastfeeding.  Physical Exam  Constitutional: She is oriented to person, place, and time. She appears well-developed and well-nourished. No distress (appears uncomfortable).  GI: Soft. She exhibits no distension and no mass. There is no tenderness. There is no rebound and no guarding.  Genitourinary:  Genitourinary Comments: External: no lesions or erythema Vagina: rugated, parous, scant bloody discharge, cervix bled slightly with gentle q-tip contact Uterus: +enlarged, anteverted, + tender, no CMT Adnexae: no masses, no tenderness left, no tenderness right   Musculoskeletal: Normal range of motion.  Neurological: She is alert and oriented to person, place, and time.  Skin: Skin is warm and dry.   Results for orders placed or performed during the hospital encounter of 10/16/16 (from the past 24 hour(s))  Urinalysis, Routine w reflex microscopic     Status: Abnormal   Collection Time: 10/16/16  5:22 PM  Result Value Ref Range   Color, Urine YELLOW YELLOW   APPearance CLEAR CLEAR   Specific Gravity, Urine 1.011 1.005 - 1.030   pH 8.0 5.0 - 8.0   Glucose, UA NEGATIVE NEGATIVE mg/dL   Hgb urine dipstick NEGATIVE NEGATIVE   Bilirubin Urine NEGATIVE NEGATIVE   Ketones, ur NEGATIVE NEGATIVE mg/dL   Protein, ur NEGATIVE NEGATIVE mg/dL   Nitrite NEGATIVE NEGATIVE   Leukocytes, UA TRACE (A) NEGATIVE   RBC / HPF 0-5 0 - 5 RBC/hpf   WBC, UA 0-5  0 - 5 WBC/hpf   Bacteria, UA NONE SEEN NONE SEEN   Squamous Epithelial / LPF 0-5 (A) NONE SEEN   Mucous PRESENT   CBC with Differential/Platelet     Status: Abnormal   Collection Time: 10/16/16  6:33 PM  Result Value Ref Range   WBC 11.6 (H) 4.0 - 10.5 K/uL   RBC 3.83 (L) 3.87 - 5.11 MIL/uL   Hemoglobin 10.1 (L) 12.0 - 15.0 g/dL   HCT 16.1 (L) 09.6 - 04.5 %   MCV 78.6 78.0 - 100.0 fL   MCH 26.4 26.0 - 34.0 pg   MCHC 33.6 30.0 - 36.0 g/dL   RDW 40.9 81.1 - 91.4 %   Platelets 455 (H) 150 - 400 K/uL   Neutrophils  Relative % 71 %   Neutro Abs 8.3 (H) 1.7 - 7.7 K/uL   Lymphocytes Relative 23 %   Lymphs Abs 2.6 0.7 - 4.0 K/uL   Monocytes Relative 4 %   Monocytes Absolute 0.5 0.1 - 1.0 K/uL   Eosinophils Relative 2 %   Eosinophils Absolute 0.2 0.0 - 0.7 K/uL   Basophils Relative 0 %   Basophils Absolute 0.0 0.0 - 0.1 K/uL  Wet prep, genital     Status: Abnormal   Collection Time: 10/16/16  7:05 PM  Result Value Ref Range   Yeast Wet Prep HPF POC NONE SEEN NONE SEEN   Trich, Wet Prep NONE SEEN NONE SEEN   Clue Cells Wet Prep HPF POC PRESENT (A) NONE SEEN   WBC, Wet Prep HPF POC FEW (A) NONE SEEN   Sperm NONE SEEN    MAU Course  Procedures Ibuprofen Augmentin  MDM Labs ordered and reviewed. No evidence of UTI, GI etiology, or acute abdominal process. Pain likely caused by endometritis. Stable for discharge home.  Assessment and Plan   1. Postpartum endometritis    Discharge home Rx Augmentin  Ibuprofen prn Follow up as scheduled in [facility]  Return for worsening sx  Allergies as of 10/16/2016   No Known Allergies     Medication List    TAKE these medications   acetaminophen 500 MG tablet Commonly known as:  TYLENOL Take 1,000 mg by mouth every 6 (six) hours as needed for mild pain or headache.   amoxicillin-clavulanate 875-125 MG tablet Commonly known as:  AUGMENTIN Take 1 tablet by mouth 2 (two) times daily.   amoxicillin-clavulanate 875-125 MG tablet Commonly known as:  AUGMENTIN Take 1 tablet by mouth 2 (two) times daily.   ibuprofen 600 MG tablet Commonly known as:  ADVIL,MOTRIN Take 1 tablet (600 mg total) by mouth every 6 (six) hours. What changed:  Another medication with the same name was added. Make sure you understand how and when to take each.   ibuprofen 800 MG tablet Commonly known as:  ADVIL,MOTRIN Take 1 tablet (800 mg total) by mouth every 8 (eight) hours as needed. What changed:  You were already taking a medication with the same name, and this prescription  was added. Make sure you understand how and when to take each.   labetalol 200 MG tablet Commonly known as:  NORMODYNE Take 2 tablets (400 mg total) by mouth 2 (two) times daily. Increase to 400 mg three times daily if headache returns      Donette Larry, [place] 10/16/2016, 6:39 PM

## 2016-10-16 NOTE — MAU Note (Addendum)
Pt is still having abdominal pain 9/10. Did take milk of magnesia and did the fleet enema yesterday. Has had several BM but is still feeling pain. Also having pain when she urinates.

## 2016-10-17 ENCOUNTER — Telehealth (HOSPITAL_COMMUNITY): Payer: Self-pay

## 2016-10-17 LAB — GC/CHLAMYDIA PROBE AMP (~~LOC~~) NOT AT ARMC
Chlamydia: NEGATIVE
Neisseria Gonorrhea: POSITIVE — AB

## 2016-10-17 NOTE — Telephone Encounter (Signed)
Called patient. Verified DOB with patient. Pt aware her lab results during her last visit to [facility] showed she was positive for Gonorrhea. Pt aware to contact her the local Health Clinic in the county she resides to be seen for treatment of her positive results. Patient aware once treatment is received, abstain from intercourse x 2 weeks. Also, she is aware to inform her partner because they will also need to be treated. Communicable Disease Report faxed to health department. 

## 2016-11-03 ENCOUNTER — Telehealth: Payer: Self-pay | Admitting: *Deleted

## 2016-11-03 ENCOUNTER — Ambulatory Visit: Payer: Self-pay | Admitting: Advanced Practice Midwife

## 2016-11-03 ENCOUNTER — Encounter: Payer: Self-pay | Admitting: Family Medicine

## 2016-11-03 NOTE — Telephone Encounter (Signed)
Jill Sparks missed her scheduled postpartum appointment and per chart review has had htn- needs to be rescheduled. I called Jill Sparks and notified her she missed her appointment. She states she thought it was tomorrow. I notified her we do want her to come in since she had htn, she states she is fine and is taking her meds but agrees with plan. I informed her one of our registars would call her back with a new appointment- she prefers afternoon.

## 2016-11-20 ENCOUNTER — Ambulatory Visit: Payer: Medicaid Other | Admitting: Obstetrics and Gynecology

## 2020-04-18 ENCOUNTER — Encounter: Attending: Obstetrics & Gynecology

## 2020-10-01 ENCOUNTER — Encounter: Payer: MEDICAID | Attending: Nurse Practitioner

## 2020-10-15 ENCOUNTER — Encounter: Payer: MEDICAID | Attending: Obstetrics & Gynecology

## 2020-10-16 ENCOUNTER — Encounter: Payer: MEDICAID | Attending: Obstetrics & Gynecology

## 2020-10-16 ENCOUNTER — Ambulatory Visit: Admit: 2020-10-16 | Discharge: 2020-10-16 | Payer: MEDICAID | Attending: Obstetrics & Gynecology

## 2020-10-16 ENCOUNTER — Ambulatory Visit: Attending: Obstetrics & Gynecology | Primary: Sports Medicine

## 2020-10-16 DIAGNOSIS — Z01419 Encounter for gynecological examination (general) (routine) without abnormal findings: Secondary | ICD-10-CM

## 2020-10-16 MED ORDER — DOXYCYCLINE 100 MG CAP
100 mg | ORAL_CAPSULE | Freq: Two times a day (BID) | ORAL | 0 refills | Status: AC
Start: 2020-10-16 — End: ?

## 2020-10-16 MED ORDER — METRONIDAZOLE 500 MG TAB
500 mg | ORAL_TABLET | ORAL | 2 refills | Status: AC
Start: 2020-10-16 — End: ?

## 2020-10-16 NOTE — Progress Notes (Signed)
Patient given RX for Flagyl at visit

## 2020-10-16 NOTE — Progress Notes (Signed)
 Jody Jones  is a 35 y.o. No obstetric history on file.  who is here for an annual exam..  She moved from [place]  about 8 months ago and is interested in establishing care.  She also will be referred to [facility] to manage her medical problems.  Today she complains of discharge with odor, pelvic pain and pain in the axillary tails of both breasts unless she wears supportive bras.  Complaints: annual exam and STD screening.      Mammogram:  Colonoscopy:  Dexa:  Pap Smear:        History  No flowsheet data found.  Past Medical History:   Diagnosis Date   . Hypertension      Past Surgical History:   Procedure Laterality Date   . HX OTHER SURGICAL      eye surgery     No current outpatient medications on file prior to visit.     No current facility-administered medications on file prior to visit.     No Known Allergies  Social History     Tobacco Use   . Smoking status: Former Games developer   . Smokeless tobacco: Never Used   Substance Use Topics   . Alcohol use: Not on file     Family History   Problem Relation Age of Onset   . Hypertension Mother        No current outpatient medications on file.     No current facility-administered medications for this visit.       Review of Systems  All ROS negative except what's noted in HPI    Constitutional:  Denies weight gain, unexplained weight loss or heat or cold intolerance  ENT: Denies change in vision, change in hearing, frequent headaches  Cardiovascular:  Denies chest pain, swelling in legs or feet, shortness of breath when lying flat  Respiratory:  Denies shortness of breath, cough greater than 2 weeks or coughing up blood  Gastro: Denies diarrhea greater than 2 weeks, rectal bleeding, bloody stools, heartburn, or constipation  GU:  Denies blood in urine, getting up more than twice at night to urinate, dysuria or incontinence  Breast:  Denies nipple discharge, masses or pain  Skin:  Denies rash greater than 2 weeks, change in moles  Musculoskeletal/Neuro:  Denies  joint pain, muscle weakness, seizures, loss of balance or frequent falls  Psych:  Denies frequent crying spells or severe anxiety  Heme:  Denies easy bruising, bleeding gums, frequent nosebleeds or swollen lymph nodes  GYN:  Denies bleeding or spotting between menses, heavy menses, menses longer than 7 days, pain with sex, severe menstrual cramps.  Social:  Feels safe in her home.  Denies ever having been threatened or hit by a family member                  Physical Exam  Blood pressure 122/70, height 5' 3 (1.6 m), weight 165 lb (74.8 kg), last menstrual period 10/01/2020. Body mass index is 29.23 kg/m.  No results found for: HGB, HGBP, HGBEXT, HGBEXT   No results found for this or any previous visit.  No results found for this or any previous visit.    HEENT unremarkable.EOMI.PERLA.   Sclera non-icteric.    Neck is supple without thyromegaly or nodes.   Chest clear to auscultation.Bilateral breath sounds equal.    Heart regular rate and rhythm with no murmur, rub or gallop.    Breast exam reveals no masses or nipple discharge.  No axillary notes  are palpable.   Abdomen is benign without organomegally.    BUS is normal.    Cervix is present.  Thick yellow discharge is present with possibility of trichomoniasis was noted on the wet prep    Pap smear is performed.    Bimanual exam reveals no masses.    Assessment  35 y.o. No obstetric history on file.  for annual exam.  Encounter Diagnoses   Name Primary?   . Encounter for well woman exam with routine gynecological exam Yes   . Routine cervical smear    . Concern about STD in female without diagnosis    . Vaginal discharge    . Vaginal odor    . Encounter to establish care        Plan  Orders Placed This Encounter   . HERPES SIMPLEX VIRUS (HSV) NAA   . HSV 1 AND 2-SPEC AB, IGG W/RFX TO SUPPL HSV-2 TESTING   . HIV SCREEN, 4TH GEN. W/REFLEX CONFIRM   . HEP B SURFACE AG   . HEPATITIS C AB   . RPR   . REFERRAL TO PRIMARY CARE     Referral Priority:   Routine      Referral Type:   Consultation     Referral Reason:   Specialty Services Required     Referred to Provider:   Oneita Francis SAUNDERS, DO     Requested Specialty:   Internal Medicine     Number of Visits Requested:   1   . POC WET MOUNT - SMEAR, STAIN & INTERPRET   . PAP IG, CT-NG, RFX APTIMA HPV ASCUS ([id number], [id number])     Order Specific Question:   Pap Source?     Answer:   Cervical and Endocervical     Order Specific Question:   Total Hysterectomy?     Answer:   No     Order Specific Question:   Supracervical Hysterectomy?     Answer:   No     Order Specific Question:   Post Menopausal?     Answer:   No     Order Specific Question:   Hormone Therapy?     Answer:   No     Order Specific Question:   IUD?     Answer:   No     Order Specific Question:   Abnormal Bleeding?     Answer:   No     Order Specific Question:   Pregnant     Answer:   No     Order Specific Question:   Post Partum?     Answer:   No     Order Specific Question:   Pap collection method?     Answer:   Brush-Spatula     pap smear done  additional lab tests per Epic[facility] orders  return in 2 weeks or prn-ultrasound and recheck  return annually or prn  medications as per orders

## 2020-10-16 NOTE — Progress Notes (Signed)
 All STD blood work is negative except for previous exposures to HSV-1 and HSV-2.  If the patient is symptomatic she can have a prescription for Valtrex.

## 2020-10-16 NOTE — Progress Notes (Signed)
Sent my chart message with results. Patient had viewed them on my chart

## 2020-10-17 LAB — HSV 1 AND 2-SPEC AB, IGG W/RFX TO SUPPL HSV-2 TESTING
HSV 1 Ab, IgG, type spec.: 45.5 index — ABNORMAL HIGH (ref 0.00–0.90)
HSV 1 IGG,TYPE SPEC, 164899: 45.5 index — ABNORMAL HIGH (ref 0.00–0.90)
HSV 2 Ab IgG, type spec.: 14.4 index — ABNORMAL HIGH (ref 0.00–0.90)
HSV 2 IGG, TYPE SPEC, 163153: 14.4 index — ABNORMAL HIGH (ref 0.00–0.90)

## 2020-10-17 LAB — HIV 1/2 AG/AB, 4TH GENERATION,W RFLX CONFIRM: HIV SCREEN 4TH GENERATION WRFX: NONREACTIVE

## 2020-10-17 LAB — HEP B SURFACE AG: Hep B surface Ag screen: NEGATIVE

## 2020-10-17 LAB — RPR
RPR: NONREACTIVE
RPR: NONREACTIVE

## 2020-10-17 LAB — HEPATITIS C AB: Hep C Virus Ab: <0.1 s/co ratio (ref 0.0–0.9)

## 2020-10-17 LAB — HIV 1/2 ANTIGEN/ANTIBODY, FOURTH GENERATION W/RFL: HIV Screen 4th Generation wRfx: NONREACTIVE

## 2020-10-17 LAB — HEPATITIS B SURFACE ANTIGEN: Hepatitis B Surface Ag: NEGATIVE

## 2020-10-17 LAB — HEPATITIS C ANTIBODY: Hepatitis C Ab: <0.1 {s_co_ratio} (ref 0.0–0.9)

## 2020-10-19 LAB — VAGINOSIS/VAGINITIS, DNA PROBE
Candida Species: NEGATIVE
Candida species: NEGATIVE
GARDNERELLA VAGINALIS: POSITIVE — AB
Gardnerella vaginalis: POSITIVE — AB
TRICHOMONAS VAGINALIS: NEGATIVE
Trichomonas vaginalis: NEGATIVE

## 2020-10-23 LAB — PAP IG, CT-NG, RFX APTIMA HPV ASCUS (183194, 507800)
.: 0
Chlamydia, Nuc. Acid Amp.: NEGATIVE
Gonococcus, Nuc. Acid Amp.: NEGATIVE

## 2020-10-23 LAB — PAP IG, CT-NG, RFX APTIMA HPV ASCUS (199320)
CHLAMYDIA, NUC. ACID AMP, 186114: NEGATIVE
GONOCOCCUS, NUC. ACID AMP, 186122: NEGATIVE
LABCORP 019018: 0

## 2020-10-24 ENCOUNTER — Telehealth: Payer: MEDICAID | Attending: Family

## 2020-10-24 NOTE — Progress Notes (Signed)
This encounter was created in error - please disregard.

## 2020-11-01 ENCOUNTER — Encounter: Payer: MEDICAID | Attending: Obstetrics & Gynecology

## 2020-12-26 ENCOUNTER — Inpatient Hospital Stay: Admit: 2020-12-26 | Payer: MEDICAID | Primary: Sports Medicine

## 2020-12-26 ENCOUNTER — Inpatient Hospital Stay
Admit: 2020-12-26 | Discharge: 2020-12-26 | Disposition: A | Discharge status: Home / Self Care | Payer: MEDICAID | Attending: Emergency Medicine

## 2020-12-26 DIAGNOSIS — R519 Headache, unspecified: Secondary | ICD-10-CM

## 2020-12-26 MED ORDER — BUTALBITAL-APAP-CAFFEINE 50-325-40 MG PO TABS
50-325-40 MG | ORAL | Status: AC
Start: 2020-12-26 — End: 2020-12-26
  Administered 2020-12-26: 21:00:00 1 via ORAL

## 2020-12-26 MED ORDER — BUTALBITAL-APAP-CAFFEINE 50-325-40 MG PO TABS
50-325-40 MG | ORAL_TABLET | Freq: Four times a day (QID) | ORAL | 0 refills | Status: DC | PRN
Start: 2020-12-26 — End: 2021-04-06

## 2020-12-26 MED FILL — BUTALBITAL-APAP-CAFFEINE 50-325-40 MG PO TABS: 50-325-40 mg | ORAL | Qty: 1

## 2020-12-26 NOTE — ED Provider Notes (Signed)
[facility] Emergency Department Provider Note                   PCP:                None Provider               Age: 35 y.o.      Sex: female     No diagnosis found.    DISPOSITION         New Prescriptions    No medications on file       Orders Placed This Encounter   Procedures   ??? CT HEAD WO CONTRAST         Jody Jones is a 35 y.o. female who presents to the Emergency Department with chief complaint of    Chief Complaint   Patient presents with   ??? Headache      Patient to ER complaining of headache for the past 3 days.  She states it is mostly on the left temporal area.  She denies any fever.  No chest pain or shortness of breath.  She does admit to some dental caries on that left side.  But no dental pain or jaw swelling.  She is use over-the-counter medicines with minimal relief.  She states her blood pressure was elevated over 200s on Sunday.  She has a blood pressure monitor at home.  She states she has elevated blood pressures maybe once a year.  She is never been put on blood pressure medicines.  She is recently moved to the [place] area from [place] and has not gotten primary care at this point.  She denies any vomiting she has intermittent tingling to both hands and feet.  She states she is not on any prescription medications and is not supposed to be on any prescription medications    The history is provided by the patient.   Headache  Pain location:  L temporal  Quality:  Dull  Radiates to:  Does not radiate  Severity currently:  9/10  Severity at highest:  9/10  Onset quality:  Gradual  Duration:  3 days  Timing:  Constant  Progression:  Waxing and waning  Chronicity:  Recurrent  Similar to prior headaches: no    Context comment:  Unknown  Relieved by:  Nothing  Worsened by:  Nothing  Ineffective treatments:  Acetaminophen and NSAIDs  Associated symptoms: tingling    Associated symptoms: no fever, no neck pain and no swollen glands        Review of Systems   Constitutional: Negative for  fever.   Musculoskeletal: Negative for neck pain.   Neurological: Positive for headaches.   All other systems reviewed and are negative.     All other systems reviewed and are negative.      Past Medical History:   Diagnosis Date   ??? Hypertension         Past Surgical History:   Procedure Laterality Date   ??? OTHER SURGICAL HISTORY      eye surgery        Family History   Problem Relation Age of Onset   ??? Hypertension Mother            Social Connections:    ??? Frequency of Communication with Friends and Family: Not on file   ??? Frequency of Social Gatherings with Friends and Family: Not on file   ??? Attends Religious Services: Not on file   ???  Active Member of Clubs or Organizations: Not on file   ??? Attends Banker Meetings: Not on file   ??? Marital Status: Not on file        No Known Allergies     Vitals signs and nursing note reviewed.   Patient Vitals for the past 4 hrs:   Temp Pulse Resp BP SpO2   12/26/20 1615 98.1 ??F (36.7 ??C) 62 16 (!) 145/90 100 %          Physical Exam  Vitals and nursing note reviewed.   Constitutional:       Appearance: Normal appearance. She is normal weight.   HENT:      Head: Normocephalic and atraumatic.      Comments: Caries to left upper and lower molars no jaw sweeling     Right Ear: External ear normal.      Left Ear: External ear normal.      Nose: Nose normal.      Mouth/Throat:      Mouth: Mucous membranes are moist.      Pharynx: Oropharynx is clear.   Eyes:      Extraocular Movements: Extraocular movements intact.      Pupils: Pupils are equal, round, and reactive to light.   Cardiovascular:      Rate and Rhythm: Normal rate and regular rhythm.      Pulses: Normal pulses.      Heart sounds: Normal heart sounds.   Pulmonary:      Effort: Pulmonary effort is normal.      Breath sounds: Normal breath sounds. No rales.   Chest:      Chest wall: No tenderness.   Abdominal:      Tenderness: There is no abdominal tenderness.      Hernia: No hernia is present.    Musculoskeletal:         General: Normal range of motion.      Cervical back: Normal range of motion and neck supple.   Skin:     General: Skin is warm and dry.   Neurological:      General: No focal deficit present.      Mental Status: She is alert and oriented to person, place, and time.      Comments: No meningeal signs   Psychiatric:         Mood and Affect: Mood normal.         Behavior: Behavior normal.          MDM  Number of Diagnoses or Management Options  Diagnosis management comments: CT HEAD WO CONTRAST   Final Result    No acute intracranial abnormality.              Patient feeling better after Fioricet.  Stressed need for primary care physician will give prescription for few Fioricet to use as needed work note given       Amount and/or Complexity of Data Reviewed  Tests in the radiology section of CPT??: ordered and reviewed  Review and summarize past medical records: yes    Risk of Complications, Morbidity, and/or Mortality  Presenting problems: moderate  Diagnostic procedures: moderate  Management options: low    Patient Progress  Patient progress: improved      Procedures    Labs Reviewed - No data to display     CT HEAD WO CONTRAST    (Results Pending)            Glasgow Coma  Scale  Eye Opening: Spontaneous  Best Verbal Response: Oriented  Best Motor Response: Obeys commands  Glasgow Coma Scale Score: 15                     Voice dictation software was used during the making of this note.  This software is not perfect and grammatical and other typographical errors may be present.  This note has not been completely proofread for errors.     Andris Flurry, [place]  12/26/20 [phone]

## 2020-12-26 NOTE — ED Triage Notes (Signed)
Patient co headache going on for 3 days. Pt states she took her bp this morning at home and it was elevated 201/110s.  pts bp in triage 145\\90

## 2020-12-26 NOTE — Discharge Instructions (Signed)
Use meds as directed, call primary care office to arrange follow-up appointment.

## 2020-12-26 NOTE — ED Notes (Signed)
I have reviewed discharge instructions with the patient.  The patient verbalized understanding.    Patient left ED via Discharge Method: ambulatory to Home with friend.    Opportunity for questions and clarification provided.       Patient given 1 scripts.         To continue your aftercare when you leave the hospital, you may receive an automated call from our care team to check in on how you are doing.  This is a free service and part of our promise to provide the best care and service to meet your aftercare needs.??? If you have questions, or wish to unsubscribe from this service please call [phone].  Thank you for Choosing our [facility] Emergency Department.          Okey Dupre, RN  12/26/20 [phone]

## 2021-04-06 ENCOUNTER — Inpatient Hospital Stay
Admit: 2021-04-06 | Discharge: 2021-04-06 | Disposition: A | Discharge status: Home / Self Care | Payer: MEDICAID | Attending: Emergency Medicine

## 2021-04-06 DIAGNOSIS — N390 Urinary tract infection, site not specified: Secondary | ICD-10-CM

## 2021-04-06 LAB — URINALYSIS
Bilirubin Urine: NEGATIVE
Blood, Urine: NEGATIVE
Glucose, UA: NEGATIVE mg/dL
Ketones, Urine: NEGATIVE mg/dL
Nitrite, Urine: NEGATIVE
Protein, UA: NEGATIVE mg/dL
Specific Gravity, UA: 1.014 (ref 1.001–1.023)
Urobilinogen, Urine: 1 EU/dL (ref 0.2–1.0)
pH, Urine: 8 (ref 5.0–9.0)

## 2021-04-06 LAB — GROUP A STREP SCREEN BY PCR: Strep, Molecular: NOT DETECTED

## 2021-04-06 LAB — COMPREHENSIVE METABOLIC PANEL
ALT: 19 U/L (ref 12–65)
AST: 16 U/L (ref 15–37)
Albumin/Globulin Ratio: 1 — ABNORMAL LOW (ref 1.2–3.5)
Albumin: 3.8 g/dL (ref 3.5–5.0)
Alk Phosphatase: 74 U/L (ref 50–136)
Anion Gap: 4 mmol/L (ref 4–13)
BUN: 7 MG/DL (ref 6–23)
CO2: 27 mmol/L (ref 21–32)
Calcium: 9.1 MG/DL (ref 8.3–10.4)
Chloride: 106 mmol/L (ref 101–110)
Creatinine: 0.7 MG/DL (ref 0.6–1.0)
GFR African American: >60 mL/min/{1.73_m2} (ref >60)
GFR Non-African American: >60 mL/min/{1.73_m2} (ref >60)
Globulin: 3.7 g/dL — ABNORMAL HIGH (ref 2.3–3.5)
Glucose: 95 mg/dL (ref 65–100)
Potassium: 3.8 mmol/L (ref 3.5–5.1)
Sodium: 137 mmol/L (ref 136–145)
Total Bilirubin: 0.7 MG/DL (ref 0.2–1.1)
Total Protein: 7.5 g/dL (ref 6.3–8.2)

## 2021-04-06 LAB — CBC WITH AUTO DIFFERENTIAL
Absolute Eos #: 0.1 10*3/uL (ref 0.0–0.8)
Absolute Immature Granulocyte: 0 10*3/uL (ref 0.0–0.5)
Absolute Lymph #: 1.2 10*3/uL (ref 0.5–4.6)
Absolute Mono #: 0.8 10*3/uL (ref 0.1–1.3)
Basophils Absolute: 0 10*3/uL (ref 0.0–0.2)
Basophils: 0 % (ref 0.0–2.0)
Eosinophils %: 0 % — ABNORMAL LOW (ref 0.5–7.8)
Hematocrit: 36.4 % (ref 35.8–46.3)
Hemoglobin: 12.2 g/dL (ref 11.7–15.4)
Immature Granulocytes: 0 % (ref 0.0–5.0)
Lymphocytes: 11 % — ABNORMAL LOW (ref 13–44)
MCH: 26.6 PG (ref 26.1–32.9)
MCHC: 33.5 g/dL (ref 31.4–35.0)
MCV: 79.5 FL — ABNORMAL LOW (ref 79.6–97.8)
MPV: 9.7 FL (ref 9.4–12.3)
Monocytes: 7 % (ref 4.0–12.0)
Platelets: 279 10*3/uL (ref 150–450)
RBC: 4.58 M/uL (ref 4.05–5.2)
RDW: 13.2 % (ref 11.9–14.6)
Seg Neutrophils: 82 % — ABNORMAL HIGH (ref 43–78)
Segs Absolute: 9.3 10*3/uL — ABNORMAL HIGH (ref 1.7–8.2)
WBC: 11.4 10*3/uL — ABNORMAL HIGH (ref 4.3–11.1)
nRBC: 0 10*3/uL (ref 0.0–0.2)

## 2021-04-06 LAB — POC PREGNANCY UR-QUAL: Preg Test, Ur: NEGATIVE

## 2021-04-06 LAB — COVID-19, RAPID: SARS-CoV-2, Rapid: NOT DETECTED

## 2021-04-06 MED ORDER — BUTALBITAL-APAP-CAFFEINE 50-325-40 MG PO TABS
50-325-40 MG | ORAL_TABLET | ORAL | 0 refills | Status: AC | PRN
Start: 2021-04-06 — End: ?

## 2021-04-06 MED ORDER — KETOROLAC TROMETHAMINE 15 MG/ML IJ SOLN
15 MG/ML | INTRAMUSCULAR | Status: AC
Start: 2021-04-06 — End: 2021-04-06
  Administered 2021-04-06: 14:00:00 15 mg via INTRAVENOUS

## 2021-04-06 MED ORDER — CIPROFLOXACIN HCL 500 MG PO TABS
500 MG | ORAL_TABLET | Freq: Two times a day (BID) | ORAL | 0 refills | Status: AC
Start: 2021-04-06 — End: 2021-04-13

## 2021-04-06 MED ORDER — ACETAMINOPHEN 500 MG PO TABS
500 MG | ORAL | Status: AC
Start: 2021-04-06 — End: 2021-04-06
  Administered 2021-04-06: 13:00:00 1000 mg via ORAL

## 2021-04-06 MED ORDER — LACTATED RINGERS IV BOLUS
Freq: Once | INTRAVENOUS | Status: AC
Start: 2021-04-06 — End: 2021-04-06
  Administered 2021-04-06: 14:00:00 1000 mL via INTRAVENOUS

## 2021-04-06 MED ORDER — IBUPROFEN 400 MG PO TABS
400 MG | ORAL | Status: AC
Start: 2021-04-06 — End: 2021-04-06
  Administered 2021-04-06: 13:00:00 400 mg via ORAL

## 2021-04-06 MED FILL — KETOROLAC TROMETHAMINE 15 MG/ML IJ SOLN: 15 mg/mL | INTRAMUSCULAR | Qty: 1

## 2021-04-06 MED FILL — IBUPROFEN 400 MG PO TABS: 400 mg | ORAL | Qty: 1

## 2021-04-06 MED FILL — TYLENOL EXTRA STRENGTH 500 MG PO TABS: 500 mg | ORAL | Qty: 2

## 2021-04-06 NOTE — Discharge Instructions (Signed)
Tylenol or ibuprofen for fever or headache.  Prescription for headache medicine if something further needed.  Take antibiotic until complete.  Recheck with your primary care doctor 3 to 4 days if not improving.  Recheck sooner for persistently high fevers vomiting or other concerns.

## 2021-04-06 NOTE — ED Provider Notes (Addendum)
Emergency Department Provider Note                   PCP:                None Provider               Age: 35 y.o.      Sex: female     No diagnosis found.    DISPOSITION          MDM  Number of Diagnoses or Management Options  Diagnosis management comments: Suspect viral syndrome with URI symptoms fever and myalgias.  Screen for COVID.  If COVID-negative, screen for flu, strep check urinalysis and consider blood work.  Doubt meningitis.  Patient is neck is supple.  No vomiting photophobia or rash.  Headache is left temporal.  Gradual onset.  Doubt subarachnoid hemorrhage       Amount and/or Complexity of Data Reviewed  Clinical lab tests: ordered and reviewed    Risk of Complications, Morbidity, and/or Mortality  Presenting problems: moderate  Diagnostic procedures: minimal  Management options: low               Orders Placed This Encounter   Procedures    COVID-19, Rapid        Medications   ibuprofen (ADVIL;MOTRIN) tablet 400 mg (has no administration in time range)   acetaminophen (TYLENOL) tablet 1,000 mg (has no administration in time range)       New Prescriptions    No medications on file        Jody Jones is a 35 y.o. female who presents to the Emergency Department with chief complaint of    Chief Complaint   Patient presents with    Concern For COVID-35      35 year old female was healthy except for some borderline blood pressure issues.  Patient started up 24 hours ago with gradual onset of left temporal headache described as achy.  Has been rather constant.  She has had some URI symptoms with nasal congestion.  She has had some chills and some aching in her back.  Also complains of some sore throat.  No chest pain or abdominal pain.  No vomiting diarrhea.  No dysuria.  Denies the possibility of pregnancy.  No sick contacts.  Concerned about COVID    The history is provided by the patient.   Headache  Pain location:  L temporal  Quality:  Dull  Radiates to:  Does not radiate  Onset quality:   Gradual  Duration:  1 day  Timing:  Constant  Progression:  Waxing and waning  Chronicity:  New  Context: not exposure to bright light    Relieved by:  Nothing  Worsened by:  Activity  Ineffective treatments:  Acetaminophen and NSAIDs  Associated symptoms: back pain, congestion, fatigue, myalgias and sore throat    Associated symptoms: no abdominal pain, no blurred vision, no cough, no diarrhea, no fever, no neck pain, no neck stiffness, no photophobia, no syncope, no tingling and no vomiting        Review of Systems   Constitutional:  Positive for chills and fatigue. Negative for fever.   HENT:  Positive for congestion and sore throat.    Eyes:  Negative for blurred vision and photophobia.   Respiratory:  Negative for cough and shortness of breath.    Cardiovascular:  Negative for chest pain and syncope.   Gastrointestinal:  Negative for abdominal pain, diarrhea and vomiting.  Genitourinary:  Negative for difficulty urinating and dysuria.   Musculoskeletal:  Positive for back pain and myalgias. Negative for neck pain and neck stiffness.   Skin:  Negative for rash.   Neurological:  Positive for headaches.     Past Medical History:   Diagnosis Date    Hypertension         Past Surgical History:   Procedure Laterality Date    OTHER SURGICAL HISTORY      eye surgery        Family History   Problem Relation Age of Onset    Hypertension Mother         Social History     Socioeconomic History    Marital status: Single   Tobacco Use    Smoking status: Former    Smokeless tobacco: Never         Patient has no known allergies.     Previous Medications    BUTALBITAL-ACETAMINOPHEN-CAFFEINE (FIORICET, ESGIC) 50-325-40 MG PER TABLET    Take 1 tablet by mouth every 6 hours as needed for Headaches        Vitals signs and nursing note reviewed.   Patient Vitals for the past 4 hrs:   Temp Pulse Resp BP SpO2   04/06/21 0823 99.6 ??F (37.6 ??C) 89 -- (!) 153/103 --   04/06/21 0751 100.1 ??F (37.8 ??C) 91 22 131/89 97 %          Physical  Exam  Vitals and nursing note reviewed.   Constitutional:       Appearance: She is not ill-appearing.   HENT:      Head: Normocephalic and atraumatic.      Right Ear: External ear normal.      Left Ear: External ear normal.      Mouth/Throat:      Mouth: Mucous membranes are moist.      Pharynx: Oropharynx is clear. Posterior oropharyngeal erythema present. No oropharyngeal exudate.   Eyes:      General: No scleral icterus.     Extraocular Movements: Extraocular movements intact.      Pupils: Pupils are equal, round, and reactive to light.   Cardiovascular:      Rate and Rhythm: Normal rate and regular rhythm.   Pulmonary:      Effort: Pulmonary effort is normal.      Breath sounds: Normal breath sounds.   Abdominal:      Palpations: Abdomen is soft.      Tenderness: There is no abdominal tenderness.   Musculoskeletal:         General: No swelling or tenderness.      Cervical back: Normal range of motion and neck supple.      Right lower leg: No edema.      Left lower leg: No edema.   Skin:     General: Skin is warm and dry.   Neurological:      General: No focal deficit present.      Mental Status: She is alert.        Procedures    No results found for any visits on 04/06/21.     No orders to display          Results Include:    Recent Results (from the past 24 hour(s))   COVID-19, Rapid    Collection Time: 04/06/21  8:00 AM    Specimen: Nasopharyngeal   Result Value Ref Range    Source NASAL  SARS-CoV-2, Rapid Not detected NOTD     Group A Strep Screen By PCR    Collection Time: 04/06/21  9:15 AM    Specimen: Throat   Result Value Ref Range    Strep, Molecular Not detected     CBC with Auto Differential    Collection Time: 04/06/21  9:33 AM   Result Value Ref Range    WBC 11.4 (H) 4.3 - 11.1 K/uL    RBC 4.58 4.05 - 5.2 M/uL    Hemoglobin 12.2 11.7 - 15.4 g/dL    Hematocrit 36.4 35.8 - 46.3 %    MCV 79.5 (L) 79.6 - 97.8 FL    MCH 26.6 26.1 - 32.9 PG    MCHC 33.5 31.4 - 35.0 g/dL    RDW 13.2 11.9 - 14.6 %     Platelets 279 150 - 450 K/uL    MPV 9.7 9.4 - 12.3 FL    nRBC 0.00 0.0 - 0.2 K/uL    Differential Type AUTOMATED      Seg Neutrophils 82 (H) 43 - 78 %    Lymphocytes 11 (L) 13 - 44 %    Monocytes 7 4.0 - 12.0 %    Eosinophils % 0 (L) 0.5 - 7.8 %    Basophils 0 0.0 - 2.0 %    Immature Granulocytes 0 0.0 - 5.0 %    Segs Absolute 9.3 (H) 1.7 - 8.2 K/UL    Absolute Lymph # 1.2 0.5 - 4.6 K/UL    Absolute Mono # 0.8 0.1 - 1.3 K/UL    Absolute Eos # 0.1 0.0 - 0.8 K/UL    Basophils Absolute 0.0 0.0 - 0.2 K/UL    Absolute Immature Granulocyte 0.0 0.0 - 0.5 K/UL   Comprehensive Metabolic Panel    Collection Time: 04/06/21  9:33 AM   Result Value Ref Range    Sodium 137 136 - 145 mmol/L    Potassium 3.8 3.5 - 5.1 mmol/L    Chloride 106 101 - 110 mmol/L    CO2 27 21 - 32 mmol/L    Anion Gap 4 4 - 13 mmol/L    Glucose 95 65 - 100 mg/dL    BUN 7 6 - 23 MG/DL    Creatinine 0.70 0.6 - 1.0 MG/DL    GFR African American >60 >60 ml/min/1.28m    GFR Non-African American >60 >60 ml/min/1.740m   Calcium 9.1 8.3 - 10.4 MG/DL    Total Bilirubin 0.7 0.2 - 1.1 MG/DL    ALT 19 12 - 65 U/L    AST 16 15 - 37 U/L    Alk Phosphatase 74 50 - 136 U/L    Total Protein 7.5 6.3 - 8.2 g/dL    Albumin 3.8 3.5 - 5.0 g/dL    Globulin 3.7 (H) 2.3 - 3.5 g/dL    Albumin/Globulin Ratio 1.0 (L) 1.2 - 3.5     POC Pregnancy Urine Qual    Collection Time: 04/06/21  9:40 AM   Result Value Ref Range    Preg Test, Ur Negative NEG     Urinalysis w rflx microscopic    Collection Time: 04/06/21 10:00 AM   Result Value Ref Range    Color, UA YELLOW/STRAW      Appearance CLEAR      Specific Gravity, UA 1.014 1.001 - 1.023      pH, Urine 8.0 5.0 - 9.0      Protein, UA Negative NEG mg/dL    Glucose,  UA Negative mg/dL    Ketones, Urine Negative NEG mg/dL    Bilirubin Urine Negative NEG      Blood, Urine Negative NEG      Urobilinogen, Urine 1.0 0.2 - 1.0 EU/dL    Nitrite, Urine Negative NEG      Leukocyte Esterase, Urine SMALL (A) NEG      WBC, UA 5-10 (A) NORM /hpf     RBC, UA 0-5 (A) NORM /hpf    Epithelial Cells UA 5-10 (A) NORM /hpf    BACTERIA, URINE 2+ (A) NORM /hpf    Casts 0-2 (A) NORM /lpf     No results found.  Suspect viral URI plus urinary tract infection.  Symptomatic treatment plus antibiotics.             Voice dictation software was used during the making of this note.  This software is not perfect and grammatical and other typographical errors may be present.  This note has not been completely proofread for errors.     Dorothyann Peng, MD  04/06/21 9390       Dorothyann Peng, MD  04/06/21 [phone]

## 2021-04-06 NOTE — ED Triage Notes (Signed)
Pt arrives via pov c/o HA, nasal congestion since Friday. Denies covid or sick exposure.

## 2021-04-06 NOTE — ED Notes (Signed)
Patient discharged with instructions and 2 prescriptions.      Darrall Dears, RN  04/06/21 1239

## 2021-04-08 LAB — POCT URINALYSIS DIPSTICK
Bilirubin, Urine, POC: NEGATIVE
Blood, UA POC: NEGATIVE
Glucose, UA POC: NEGATIVE mg/dL
Ketones, Urine, POC: NEGATIVE mg/dL
Nitrite, Urine, POC: NEGATIVE
Protein, Urine, POC: NEGATIVE mg/dL
Specific Gravity, Urine, POC: 1.015 (ref 1.001–1.023)
URINE UROBILINOGEN POC: 0.2 EU/dL (ref 0.2–1.0)
pH, Urine, POC: 8.5 (ref 5.0–9.0)

## 2021-08-05 ENCOUNTER — Encounter
Admit: 2021-08-05 | Discharge: 2021-08-05 | Payer: MEDICAID | Attending: Obstetrics & Gynecology | Primary: Sports Medicine

## 2021-08-05 DIAGNOSIS — N898 Other specified noninflammatory disorders of vagina: Secondary | ICD-10-CM

## 2021-08-05 LAB — AMB POC SMEAR, STAIN & INTERPRET, WET MOUNT: Wet Prep (POC): NEGATIVE

## 2021-08-05 NOTE — Progress Notes (Signed)
 HPI  Jody Jones is a 36 y.o. female seen for vaginal discharge. She is not having any vaginal itching or burning, but just a discharge with a red tinge.  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    Wet prep was unremarkable except for red blood cells.  She is at midcycle.  And ultrasound revealed a 12 mm endometrial thickness all compatible with midcycle.  The ovaries and uterus were normal.  On today's exam the blood pressure was 140/100.  She went to obtain primary care after her last visit.  She had some blood work done.  She states her blood pressure was elevated there also.  She was to keep a diary of blood pressures and return for follow-up but she moved.  Deposition now is 53 minutes away and she will be rereferred to [facility].  Past Medical History, Past Surgical History, Family history, Social History, and Medications were all reviewed with the patient today and updated as necessary.     Current Outpatient Medications   Medication Sig    butalbital-acetaminophen-caffeine (FIORICET, ESGIC) 50-325-40 MG per tablet Take 1 tablet by mouth every 4 hours as needed for Headaches     No current facility-administered medications for this visit.     No Known Allergies  Past Medical History:   Diagnosis Date    Hypertension      Past Surgical History:   Procedure Laterality Date    OTHER SURGICAL HISTORY      eye surgery     Family History   Problem Relation Age of Onset    Hypertension Mother       Social History     Tobacco Use    Smoking status: Some Days     Types: Cigarettes    Smokeless tobacco: Never   Substance Use Topics    Alcohol use: Not on file       Social History     Substance and Sexual Activity   Sexual Activity Not on file     OB History   Gravida Para Term Preterm AB Living   4 3 0 0 1 0   SAB IAB Ectopic Molar Multiple Live Births   0 0 0 0 0 0      # Outcome Date GA Lbr Len/2nd Weight Sex Delivery Anes PTL Lv   4 AB            3 Para             2 Para            1 Para                Health Maintenance      ROS:    Review of Systems  General: Not Present- Chills, Fever, Fatigue, Insomnia, Hot flashes/Night sweats, Weight gain  Skin: Not Present- Bruising, Change in Wart/Mole, Excessive Sweating, Itching, Nail Changes, New Lesions, Rash, Skin Color Changes and Ulcer.  HEENT: Not Present- Headache, Blurred Vision, Double Vision, Glaucoma, Visual Disturbances, Hearing Loss, Ringing in the Ears, Vertigo, Nose Bleed, Bleeding Gums, Hoarseness and Sore Throat.  Neck: Not Present- Neck Pain and Neck Swelling.  Respiratory: Not Present- Cough, Difficulty Breathing and Difficulty Breathing on Exertion.  Breast: Not Present- Breast Mass, Breast Pain, Breast Swelling, Nipple Discharge, Nipple Pain, Recent Breast Size Changes and Skin Changes.  Cardiovascular: Not Present- Abnormal Blood Pressure, Chest Pain, Edema, Fainting / Blacking Out,  Palpitations, Shortness of Breath and Swelling of Extremities.  Gastrointestinal: Not Present- Abdominal Pain, Abdominal Swelling, Bloating, Change in Bowel Habits, Constipation, Diarrhea, Difficulty Swallowing, Gets full quickly at meals, Nausea, Rectal Bleeding and Vomiting.  Female Genitourinary: Not Present- Dysmenorrhea, Dyspareunia, Decreased libido, Excessive Menstrual Bleeding, Menstrual Irregularities, Pelvic Pain, Urinary Complaints, Vaginal Discharge, Vaginal itching/burning, Vaginal odor  Musculoskeletal: Not Present- Joint Pain and Muscle Pain.  Neurological: Not Present- Dizziness, Fainting, Headaches and Seizures.  Psychiatric: Not Present- Anxiety, Depression, Mood changes and Panic Attacks.  Endocrine: Not Present- Appetite Changes, Cold Intolerance, Excessive Thirst, Excessive Urination and Heat Intolerance.  Hematology: Not Present- Abnormal Bleeding, Easy Bruising and Enlarged Lymph Nodes.       PHYSICAL EXAM:    BP (!) 140/100 (Position: Sitting)   Ht 5\' 4"  (1.626 m)   Wt 181 lb (82.1 kg)   LMP  07/15/2021   BMI 31.07 kg/m       Some white discharge was present and the wet prep revealed only red blood cells.  An ultrasound follow-up which did not reveal any abnormality of the endometrial cavity.  The pelvis is nontender to palpation.    Medical problems and test results were reviewed with the patient today.     ASSESSMENT and PLAN    1. Vaginal discharge  -     Nuswab Vaginitis Plus (VG+)  -     Smear, Wet [place], [place] ([id number])  -     [place] NON OB TRANSVAGINAL  2. Hypertension, unspecified type  -     [facility] - [facility], [place]           Time:  I spent  30 minutes in preparing to see patient (including chart review and preparation), obtaining and/or reviewing additional medical history, performing a physical exam and evaluation, documenting clinical information in the electronic health record, independently interpreting results, communicating results to patient, family or caregiver, and/or coordinating care.          No follow-ups on file.       Keane Police, MD

## 2021-08-08 LAB — NUSWAB VAGINITIS PLUS (VG+)
Candida albicans, NAA: NEGATIVE
Candida glabrata: NEGATIVE
Chlamydia trachomatis, NAA: NEGATIVE
Neisseria Gonorrhoeae, NAA: NEGATIVE
Trichomonas Vaginalis by NAA: NEGATIVE

## 2021-08-15 ENCOUNTER — Inpatient Hospital Stay
Admit: 2021-08-15 | Discharge: 2021-08-16 | Disposition: A | Discharge status: Home / Self Care | Payer: MEDICAID | Attending: General Practice

## 2021-08-15 DIAGNOSIS — R519 Headache, unspecified: Secondary | ICD-10-CM

## 2021-08-15 MED ORDER — BUTALBITAL-APAP-CAFFEINE 50-325-40 MG PO TABS
50-325-40 MG | ORAL | Status: AC
Start: 2021-08-15 — End: 2021-08-15
  Administered 2021-08-15: 1 via ORAL

## 2021-08-15 MED FILL — BUTALBITAL-APAP-CAFFEINE 50-325-40 MG PO TABS: 50-325-40 mg | ORAL | Qty: 1

## 2021-08-15 NOTE — ED Notes (Signed)
I have reviewed discharge instructions with the patient.  The patient verbalized understanding.    Patient left ED via Discharge Method: ambulatory to Home with self  Opportunity for questions and clarification provided.       Patient given 1 scripts.         To continue your aftercare when you leave the hospital, you may receive an automated call from our care team to check in on how you are doing.  This is a free service and part of our promise to provide the best care and service to meet your aftercare needs.??? If you have questions, or wish to unsubscribe from this service please call [phone].  Thank you for Choosing our [facility] Emergency Department.        Cristopher Peru, RN  08/15/21 2112

## 2021-08-15 NOTE — ED Triage Notes (Signed)
Pt ambulatory to triage with CO HA with HTN. Reports never been dx with HTN. Denies unilateral weakness. Reports nausea. Denies vision changes. Denies relief with Excedrin, tylenol, motrin.

## 2021-08-15 NOTE — ED Provider Notes (Signed)
Emergency Department Provider Note                   PCP:                None None               Age: 36 y.o.      Sex: female     No diagnosis found.    DISPOSITION          Medical Decision Making  Patient presents with headache.  I have considered emergencies for headache such as subarachnoid, meningitis, cerebral venous thrombosis, among other emergent causes for headache.  This is similar to patient's previous headaches.  She is requesting to be discharged after Fioricet.  Patient will be prescribed Fioricet like last time.  Patient encouraged to follow-up with primary care.  Return precautions given.  I have reviewed the records from the ER here from December 26, 2020    Problems Addressed:  Acute nonintractable headache, unspecified headache type: acute illness or injury    Risk  Prescription drug management.       Complexity of Problem: 1 stable, acute illness. (3)        Considerations: The following labs and/or imaging studies were considered but not ordered:   I did consider CT imaging but this is similar to patient's previous headaches         patient to follow-up with primary care and return precaution given.        No orders of the defined types were placed in this encounter.       Medications - No data to display    New Prescriptions    No medications on file        Jody Jones is a 36 y.o. female who presents to the Emergency Department with chief complaint of    Chief Complaint   Patient presents with    Headache      Presents with left-sided headache.  She has had it for 3 days.  Gradual onset.  It peaked today and it does get worse at nighttime.  She denies maximal onset.  Patient denies vision changes or vomiting.  She denies any unilateral weakness or numbness.  She has had a headache like this before.  In fact she was here in June of 2022 with similar headache.        Review of Systems   All other systems reviewed and are negative.    Past Medical History:   Diagnosis Date    Hypertension          Past Surgical History:   Procedure Laterality Date    OTHER SURGICAL HISTORY      eye surgery        Family History   Problem Relation Age of Onset    Hypertension Mother         Social History     Socioeconomic History    Marital status: Single   Tobacco Use    Smoking status: Some Days     Types: Cigarettes    Smokeless tobacco: Never   Vaping Use    Vaping Use: Some days    Substances: Nicotine    Devices: Disposable         Patient has no known allergies.     Previous Medications    BUTALBITAL-ACETAMINOPHEN-CAFFEINE (FIORICET, ESGIC) 50-325-40 MG PER TABLET    Take 1 tablet by mouth every 4 hours as needed for  Headaches        Vitals signs and nursing note reviewed.   Patient Vitals for the past 4 hrs:   Temp Pulse Resp BP SpO2   08/15/21 1831 97.9 ??F (36.6 ??C) 75 16 (!) 157/104 100 %          Physical Exam  Constitutional:       General: She is not in acute distress.  HENT:      Head: Normocephalic and atraumatic.      Nose: Nose normal.      Mouth/Throat:      Mouth: Mucous membranes are moist.   Eyes:      Extraocular Movements: Extraocular movements intact.      Pupils: Pupils are equal, round, and reactive to light.   Cardiovascular:      Rate and Rhythm: Normal rate and regular rhythm.      Heart sounds: Normal heart sounds.   Pulmonary:      Effort: No respiratory distress.      Breath sounds: No wheezing.   Abdominal:      General: Abdomen is flat.      Palpations: Abdomen is soft.      Tenderness: There is no abdominal tenderness.   Musculoskeletal:         General: No swelling or tenderness.      Cervical back: Normal range of motion.   Skin:     Findings: No erythema or rash.   Neurological:      General: No focal deficit present.      Mental Status: She is oriented to person, place, and time.   Psychiatric:         Mood and Affect: Mood normal.         Behavior: Behavior normal.        Procedures    No results found for any visits on 08/15/21.     No orders to display                       Voice  dictation software was used during the making of this note.  This software is not perfect and grammatical and other typographical errors may be present.  This note has not been completely proofread for errors.     Sandi Raveling, DO  08/15/21 2109

## 2021-08-16 MED ORDER — BUTALBITAL-APAP-CAFF-COD 50-300-40-30 MG PO CAPS
50-300-40-30 MG | ORAL_CAPSULE | ORAL | 0 refills | Status: AC | PRN
Start: 2021-08-16 — End: 2021-08-25

## 2021-09-11 ENCOUNTER — Encounter: Payer: MEDICAID | Attending: Family

## 2021-11-07 ENCOUNTER — Encounter: Payer: MEDICAID | Attending: Obstetrics & Gynecology

## 2021-11-07 NOTE — Progress Notes (Unsigned)
HPI  Jody Jones is a 36 y.o. female seen for     Past Medical History, Past Surgical History, Family history, Social History, and Medications were all reviewed with the patient today and updated as necessary.     Current Outpatient Medications   Medication Sig    butalbital-acetaminophen-caffeine (FIORICET, ESGIC) 50-325-40 MG per tablet Take 1 tablet by mouth every 4 hours as needed for Headaches (Patient not taking: Reported on 08/15/2021)     No current facility-administered medications for this visit.     No Known Allergies  Past Medical History:   Diagnosis Date    Hypertension      Past Surgical History:   Procedure Laterality Date    OTHER SURGICAL HISTORY      eye surgery     Family History   Problem Relation Age of Onset    Hypertension Mother       Social History     Tobacco Use    Smoking status: Some Days     Types: Cigarettes    Smokeless tobacco: Never   Substance Use Topics    Alcohol use: Not on file       Social History     Substance and Sexual Activity   Sexual Activity Not on file     OB History   Gravida Para Term Preterm AB Living   4 3 0 0 1 0   SAB IAB Ectopic Molar Multiple Live Births   0 0 0 0 0 0      # Outcome Date GA Lbr Len/2nd Weight Sex Delivery Anes PTL Lv   4 AB            3 Para            2 Para            1 Para                Health Maintenance  Mammogram:   Colonoscopy:   Bone Density:    ROS:    Review of Systems  General: Not Present- Chills, Fever, Fatigue, Insomnia, Hot flashes/Night sweats, Weight gain  Skin: Not Present- Bruising, Change in Wart/Mole, Excessive Sweating, Itching, Nail Changes, New Lesions, Rash, Skin Color Changes and Ulcer.  HEENT: Not Present- Headache, Blurred Vision, Double Vision, Glaucoma, Visual Disturbances, Hearing Loss, Ringing in the Ears, Vertigo, Nose Bleed, Bleeding Gums, Hoarseness and Sore Throat.  Neck: Not Present- Neck Pain and Neck Swelling.  Respiratory: Not Present- Cough, Difficulty Breathing and Difficulty Breathing on  Exertion.  Breast: Not Present- Breast Mass, Breast Pain, Breast Swelling, Nipple Discharge, Nipple Pain, Recent Breast Size Changes and Skin Changes.  Cardiovascular: Not Present- Abnormal Blood Pressure, Chest Pain, Edema, Fainting / Blacking Out, Palpitations, Shortness of Breath and Swelling of Extremities.  Gastrointestinal: Not Present- Abdominal Pain, Abdominal Swelling, Bloating, Change in Bowel Habits, Constipation, Diarrhea, Difficulty Swallowing, Gets full quickly at meals, Nausea, Rectal Bleeding and Vomiting.  Female Genitourinary: Not Present- Dysmenorrhea, Dyspareunia, Decreased libido, Excessive Menstrual Bleeding, Menstrual Irregularities, Pelvic Pain, Urinary Complaints, Vaginal Discharge, Vaginal itching/burning, Vaginal odor  Musculoskeletal: Not Present- Joint Pain and Muscle Pain.  Neurological: Not Present- Dizziness, Fainting, Headaches and Seizures.  Psychiatric: Not Present- Anxiety, Depression, Mood changes and Panic Attacks.  Endocrine: Not Present- Appetite Changes, Cold Intolerance, Excessive Thirst, Excessive Urination and Heat Intolerance.  Hematology: Not Present- Abnormal Bleeding, Easy Bruising and Enlarged Lymph Nodes.       PHYSICAL EXAM:  There were no vitals taken for this visit.          Medical problems and test results were reviewed with the patient today.     ASSESSMENT and PLAN    {There are no diagnoses linked to this encounter. (Refresh or delete this SmartLink)}           Time:  I spent  *** minutes in preparing to see patient (including chart review and preparation), obtaining and/or reviewing additional medical history, performing a physical exam and evaluation, documenting clinical information in the electronic health record, independently interpreting results, communicating results to patient, family or caregiver, and/or coordinating care.          No follow-ups on file.       Nehemiah Settle, MA

## 2021-12-21 LAB — HEMOGLOBIN A1C
Estimated Avg Glucose, External: 111 mg/dL
Hemoglobin A1C, External: 5.5 % (ref 4.8–5.6)

## 2022-01-02 ENCOUNTER — Encounter: Payer: MEDICAID | Attending: Obstetrics & Gynecology

## 2022-01-02 DIAGNOSIS — Z01419 Encounter for gynecological examination (general) (routine) without abnormal findings: Secondary | ICD-10-CM

## 2022-01-02 NOTE — Progress Notes (Unsigned)
01/01/2022    Netta Cedars  September 17, 1985      PCP: None None  Patient {DOES_DOES UJW:11914} see them for regular preventative visits.      HPI: 36 y.o. year old G42P0010 Here for annual gyn wellness exam.   New pt to me. Saw [facility] in [place]. Would like std screen.   BC = ***.    No LMP recorded.    Social History     Socioeconomic History    Marital status: Single   Tobacco Use    Smoking status: Some Days     Types: Cigarettes    Smokeless tobacco: Never   Vaping Use    Vaping Use: Some days    Substances: Nicotine    Devices: Disposable     Last pap - [facility] 10-2020  Lipids- ***  Gardasil - ***  Hx GDM: ***      Allergies, medications, past medical and surgical history, social history, family history all reviewed.       Review of Systems  + for ***    Constitutional:  Denies unexplained weight loss/gain or heat/ cold intolerance/loss of balance  ENT: Denies blurred vision, loss of hearing, hoarseness  Cardiovascular:  Denies chest pain, swelling in legs or feet, shortness of breath when lying flat  Respiratory:  Denies shortness of breath, cough greater than 2 weeks or coughing up blood  Gastro: Denies diarrhea greater than 2 weeks, rectal bleeding, bloody stools, heartburn, or constipation  GU:  Denies blood in urine, nocturia, dysuria or incontinence  Breast:  Denies nipple discharge, masses or pain  Skin:  Denies rash greater than 2 weeks, change in moles  Musculoskeletal/Neuro:  Denies joint pain, muscle weakness, seizures, headaches  Psych:  Denies new onset depression, anxiety, panic attacks  Heme:  Denies easy bruising, bleeding gums, frequent nosebleeds or swollen lymph nodes  GYN:  Denies bleeding or spotting between menses, heavy menses, menses longer than 7 days, pain with sex, severe menstrual cramps, bleeding after sex    Patient referred to a PCP for any significant nongyn findings on ROS.     There were no vitals taken for this visit.    There is no height or weight on file to calculate  BMI.    Wt Readings from Last 3 Encounters:   08/15/21 182 lb (82.6 kg)   08/05/21 181 lb (82.1 kg)   04/06/21 172 lb (78 kg)         Pt in no distress. Alert and oriented x3. Affect bright.  Well developed, well nourished.  HEENT: normocephalic, atraumatic. Sclerae nonicteric.  Neck supple w/o thyromegaly. Trachea midline.  Lungs:  Respiratory effort normal.   Breasts: no masses or axillary lymphadenopathy  Abdomen: soft and nontender, no masses, no organomegaly, no ventral hernia  Pelvic: normal external genitalia, urethral meatus, vagina and cervix. Bimanual exam w/o obvious masses or tenderness. Bladder nontender. Uterus normal size. Adnexae normal.  Perineum and anus normal. No hemorrhoids.         There are no diagnoses linked to this encounter.        Pt counseling: ***    Luther Hearing, MD

## 2022-01-21 ENCOUNTER — Encounter: Payer: MEDICAID | Attending: Obstetrics & Gynecology

## 2022-01-21 DIAGNOSIS — Z01419 Encounter for gynecological examination (general) (routine) without abnormal findings: Secondary | ICD-10-CM

## 2022-01-21 NOTE — Progress Notes (Deleted)
01/20/2022    Jody Jones  12-22-1985      PCP: None None  Patient {DOES_DOES ENI:77824} see them for regular preventative visits.    HPI: 36 y.o. year old G58P0010 Here for annual gyn wellness exam.   New pt to me. Saw [facility] in [place]. Would like std screen.   BC = ***.      No LMP recorded.    Social History     Socioeconomic History    Marital status: Single   Tobacco Use    Smoking status: Some Days     Types: Cigarettes    Smokeless tobacco: Never   Vaping Use    Vaping Use: Some days    Substances: Nicotine    Devices: Disposable     Last pap - [facility] 10-2020  Lipids- ***  Gardasil - ***  Hx GDM: ***      Allergies, medications, past medical and surgical history, social history, family history all reviewed.       Review of Systems  + for ***    Constitutional:  Denies unexplained weight loss/gain or heat/ cold intolerance/loss of balance  ENT: Denies blurred vision, loss of hearing, hoarseness  Cardiovascular:  Denies chest pain, swelling in legs or feet, shortness of breath when lying flat  Respiratory:  Denies shortness of breath, cough greater than 2 weeks or coughing up blood  Gastro: Denies diarrhea greater than 2 weeks, rectal bleeding, bloody stools, heartburn, or constipation  GU:  Denies blood in urine, nocturia, dysuria or incontinence  Breast:  Denies nipple discharge, masses or pain  Skin:  Denies rash greater than 2 weeks, change in moles  Musculoskeletal/Neuro:  Denies joint pain, muscle weakness, seizures, headaches  Psych:  Denies new onset depression, anxiety, panic attacks  Heme:  Denies easy bruising, bleeding gums, frequent nosebleeds or swollen lymph nodes  GYN:  Denies bleeding or spotting between menses, heavy menses, menses longer than 7 days, pain with sex, severe menstrual cramps, bleeding after sex    Patient referred to a PCP for any significant nongyn findings on ROS.     There were no vitals taken for this visit.    There is no height or weight on file to calculate  BMI.    Wt Readings from Last 3 Encounters:   08/15/21 182 lb (82.6 kg)   08/05/21 181 lb (82.1 kg)   04/06/21 172 lb (78 kg)         Pt in no distress. Alert and oriented x3. Affect bright.  Well developed, well nourished.  HEENT: normocephalic, atraumatic. Sclerae nonicteric.  Neck supple w/o thyromegaly. Trachea midline.  Lungs:  Respiratory effort normal.   Breasts: no masses or axillary lymphadenopathy  Abdomen: soft and nontender, no masses, no organomegaly, no ventral hernia  Pelvic: normal external genitalia, urethral meatus, vagina and cervix. Bimanual exam w/o obvious masses or tenderness. Bladder nontender. Uterus normal size. Adnexae normal.  Perineum and anus normal. No hemorrhoids.         There are no diagnoses linked to this encounter.        Pt counseling: ***    Luther Hearing, MD

## 2022-04-07 ENCOUNTER — Inpatient Hospital Stay
Admit: 2022-04-07 | Discharge: 2022-04-07 | Disposition: A | Discharge status: Home / Self Care | Payer: MEDICAID | Attending: Emergency Medicine

## 2022-04-07 DIAGNOSIS — R202 Paresthesia of skin: Secondary | ICD-10-CM

## 2022-04-07 DIAGNOSIS — M1990 Unspecified osteoarthritis, unspecified site: Secondary | ICD-10-CM

## 2022-04-07 MED ORDER — HYDROCODONE-ACETAMINOPHEN 5-325 MG PO TABS
5-325 MG | ORAL_TABLET | ORAL | 0 refills | Status: AC | PRN
Start: 2022-04-07 — End: 2022-04-10

## 2022-04-07 NOTE — ED Provider Notes (Signed)
Emergency Department Provider Note       PCP: None None   Age: 36 y.o.   Sex: female     DISPOSITION Decision To Discharge 04/07/2022 01:43:50 PM       ICD-10-CM    1. Paresthesia  R20.2 [facility] - [facility]     HYDROcodone-acetaminophen (NORCO) 5-325 MG per tablet      2. Arthritis  M19.90 [facility]          Medical Decision Making     Complexity of Problems Addressed:  1 or more acute illnesses that pose a threat to life or bodily function.     Data Reviewed and Analyzed:   I independently ordered and reviewed each unique test.                   Discussion of management or test interpretation.    Patient is a 36 year old female who presents with bilateral hand and feet tingling and pain for she states at least 6 months.  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  Patient has received IVIG.  Patient has also been on gabapentin and is requesting pain medication.  Patient also requesting a second opinion.  Patient denies any new complaints and denies any difficulty breathing.  Patient does have a history of migraine headaches but no headache now.    Patient's physical exam is unremarkable and shows no acute findings.  Patient has been referred to neurology with antibiotics or systemic she is seeking a second opinion.  Patient has been encouraged to continue her current neurology appointments as well as with her PCP and states she is evaluated by neurology at [facility]  Patient is also requesting a rheumatology referral as she has arthritis in her knees.      Risk of Complications and/or Morbidity of Patient Management:  Prescription drug management performed.  Shared medical decision making was utilized in creating the patients health plan today.         Is this patient to be included in the SEP-1 core measure due to severe sepsis or septic shock? No Exclusion criteria - the  patient is NOT to be included for SEP-1 Core Measure due to: Infection is not suspected      History      Patient is a 36 year old female who presents with bilateral hand and feet tingling and pain for she states at least 6 months.  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  Patient has received IVIG.  Patient has also been on gabapentin and is requesting pain medication.  Patient also requesting a second opinion.  Patient denies any new complaints and denies any difficulty breathing.  Patient does have a history of migraine headaches but no headache now.           Review of Systems   Neurological:         Bilateral hands and feet pain   All other systems reviewed and are negative.      Physical Exam     Vitals signs and nursing note reviewed:  Vitals:    04/07/22 1330   BP: (!) 143/95   Pulse: 77   Resp: 16   Temp: 97.9 F (36.6 C)   SpO2: 100%   Weight: 182 lb (82.6 kg)   Height: 5\' 3"  (1.6 m)  Physical Exam  Vitals and nursing note reviewed.   Constitutional:       Appearance: Normal appearance.   HENT:      Head: Normocephalic and atraumatic.      Nose: Nose normal.      Mouth/Throat:      Mouth: Mucous membranes are moist.      Pharynx: Oropharynx is clear.   Eyes:      Extraocular Movements: Extraocular movements intact.      Conjunctiva/sclera: Conjunctivae normal.      Pupils: Pupils are equal, round, and reactive to light.   Cardiovascular:      Rate and Rhythm: Normal rate.      Pulses: Normal pulses.   Pulmonary:      Effort: Pulmonary effort is normal.   Abdominal:      General: Abdomen is flat. Bowel sounds are normal.      Palpations: Abdomen is soft.   Musculoskeletal:         General: Normal range of motion.      Cervical back: Normal range of motion and neck supple.   Skin:     General: Skin is warm.      Capillary Refill: Capillary refill takes less than 2 seconds.   Neurological:      General: No focal  deficit present.      Mental Status: She is alert and oriented to person, place, and time. Mental status is at baseline.      Comments: Bilateral hands and feet tenderness palpation.  No deformity.  Neurovascular tact distally.   Psychiatric:         Mood and Affect: Mood normal.         Behavior: Behavior normal.         Thought Content: Thought content normal.         Judgment: Judgment normal.          Procedures     Procedures    Orders Placed This Encounter   Procedures    [facility] - [facility]    [facility]        Medications given during this emergency department visit:  Medications - No data to display    New Prescriptions    HYDROCODONE-ACETAMINOPHEN (NORCO) 5-325 MG PER TABLET    Take 1 tablet by mouth every 4 hours as needed for Pain for up to 3 days. Intended supply: 3 days. Take lowest dose possible to manage pain Max Daily Amount: 6 tablets        Past Medical History:   Diagnosis Date    Hypertension         Past Surgical History:   Procedure Laterality Date    OTHER SURGICAL HISTORY      eye surgery        Social History     Socioeconomic History    Marital status: Single     Spouse name: None    Number of children: None    Years of education: None    Highest education level: None   Tobacco Use    Smoking status: Some Days     Types: Cigarettes    Smokeless tobacco: Never   Vaping Use    Vaping Use: Some days    Substances: Nicotine    Devices: Disposable        Previous Medications    BUTALBITAL-ACETAMINOPHEN-CAFFEINE (FIORICET, ESGIC) 50-325-40 MG PER TABLET    Take 1 tablet by mouth  every 4 hours as needed for Headaches        No results found for any visits on 04/07/22.     No orders to display                     Voice dictation software was used during the making of this note.  This software is not perfect and grammatical and other typographical errors may be present.  This note has not been completely proofread for errors.       Olyn Landstrom Cathey Endow, MD  04/07/22 1350

## 2022-04-07 NOTE — ED Triage Notes (Addendum)
Pt reports shooting "lightening pain" x33months radiating from hands and feet up to body bilaterally. Per pt has seen provider without diagnosis. (-)cp, dyspnea, dizziness  (+)numbness to fingers and toes    Ambulatory w steady gait   A&Ox4

## 2022-04-07 NOTE — ED Notes (Signed)
I have reviewed discharge instructions with the patient.  The patient verbalized understanding.    Patient left ED via Discharge Method: ambulatory to Home with self.    Opportunity for questions and clarification provided.       Patient given 1 scripts.         To continue your aftercare when you leave the hospital, you may receive an automated call from our care team to check in on how you are doing.  This is a free service and part of our promise to provide the best care and service to meet your aftercare needs." If you have questions, or wish to unsubscribe from this service please call [phone].  Thank you for Choosing our [facility] Emergency Department.        Samuel Germany, LPN  99/37/16 [id number]

## 2022-09-05 DEATH — deceased
